# Patient Record
Sex: Male | Born: 1970 | Race: Black or African American | Hispanic: No | Marital: Single | Smoking: Never smoker
Health system: Southern US, Community
[De-identification: ages and names within clinical notes are randomized; demographics above are authoritative.]

## PROBLEM LIST (undated history)

## (undated) DIAGNOSIS — Z8719 Personal history of other diseases of the digestive system: Secondary | ICD-10-CM

## (undated) DIAGNOSIS — K625 Hemorrhage of anus and rectum: Secondary | ICD-10-CM

## (undated) DIAGNOSIS — D500 Iron deficiency anemia secondary to blood loss (chronic): Secondary | ICD-10-CM

## (undated) HISTORY — PX: STOMACH SURGERY: SHX791

## (undated) HISTORY — PX: ABDOMINAL SURGERY: SHX537

---
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
## 2000-11-20 ENCOUNTER — Emergency Department (HOSPITAL_COMMUNITY): Admission: EM | Admit: 2000-11-20 | Discharge: 2000-11-20 | Payer: Self-pay

## 2002-01-18 ENCOUNTER — Emergency Department (HOSPITAL_COMMUNITY): Admission: EM | Admit: 2002-01-18 | Discharge: 2002-01-18 | Payer: Self-pay

## 2004-09-03 ENCOUNTER — Emergency Department (HOSPITAL_COMMUNITY): Admission: EM | Admit: 2004-09-03 | Discharge: 2004-09-03 | Payer: Self-pay | Admitting: Emergency Medicine

## 2004-10-26 ENCOUNTER — Emergency Department (HOSPITAL_COMMUNITY): Admission: EM | Admit: 2004-10-26 | Discharge: 2004-10-26 | Payer: Self-pay | Admitting: Emergency Medicine

## 2007-06-24 ENCOUNTER — Emergency Department (HOSPITAL_COMMUNITY): Admission: EM | Admit: 2007-06-24 | Discharge: 2007-06-24 | Payer: Self-pay | Admitting: Emergency Medicine

## 2010-09-30 LAB — URINALYSIS, ROUTINE W REFLEX MICROSCOPIC
Bilirubin Urine: NEGATIVE
Glucose, UA: NEGATIVE
Ketones, ur: NEGATIVE
Leukocytes, UA: NEGATIVE
Nitrite: NEGATIVE
Protein, ur: NEGATIVE
Specific Gravity, Urine: 1.03
Urobilinogen, UA: 0.2
pH: 5.5

## 2010-09-30 LAB — URINE MICROSCOPIC-ADD ON

## 2011-12-22 ENCOUNTER — Emergency Department (HOSPITAL_COMMUNITY)
Admission: EM | Admit: 2011-12-22 | Discharge: 2011-12-22 | Disposition: A | Discharge status: Home / Self Care | Payer: Self-pay | Attending: Emergency Medicine | Admitting: Emergency Medicine

## 2011-12-22 ENCOUNTER — Emergency Department (HOSPITAL_COMMUNITY): Payer: Self-pay

## 2011-12-22 ENCOUNTER — Encounter (HOSPITAL_COMMUNITY): Payer: Self-pay | Admitting: Emergency Medicine

## 2011-12-22 DIAGNOSIS — R079 Chest pain, unspecified: Secondary | ICD-10-CM | POA: Insufficient documentation

## 2011-12-22 LAB — BASIC METABOLIC PANEL
CO2: 26 mEq/L (ref 19–32)
Chloride: 102 mEq/L (ref 96–112)
Potassium: 3.6 mEq/L (ref 3.5–5.1)
Sodium: 137 mEq/L (ref 135–145)

## 2011-12-22 LAB — CBC
MCH: 27.4 pg (ref 26.0–34.0)
Platelets: 207 10*3/uL (ref 150–400)
RBC: 5.18 MIL/uL (ref 4.22–5.81)
WBC: 4.1 10*3/uL (ref 4.0–10.5)

## 2011-12-22 LAB — COMPREHENSIVE METABOLIC PANEL
ALT: 25 U/L (ref 0–53)
AST: 36 U/L (ref 0–37)
CO2: 27 mEq/L (ref 19–32)
Calcium: 9.5 mg/dL (ref 8.4–10.5)
GFR calc non Af Amer: 73 mL/min — ABNORMAL LOW (ref >90)
Sodium: 136 mEq/L (ref 135–145)

## 2011-12-22 LAB — D-DIMER, QUANTITATIVE: D-Dimer, Quant: 0.68 ug/mL-FEU — ABNORMAL HIGH (ref 0.00–0.48)

## 2011-12-22 MED ORDER — OXYCODONE-ACETAMINOPHEN 5-325 MG PO TABS: 1.0000 | ORAL_TABLET | Freq: Four times a day (QID) | ORAL | Status: DC | PRN

## 2011-12-22 MED ORDER — IOHEXOL 350 MG/ML SOLN
100.0000 mL | Freq: Once | INTRAVENOUS | Status: AC | PRN
  Administered 2011-12-22: 100 mL via INTRAVENOUS

## 2011-12-22 MED ORDER — SODIUM CHLORIDE 0.9 % IV BOLUS (SEPSIS)
500.0000 mL | Freq: Once | INTRAVENOUS | Status: AC
  Administered 2011-12-22: 500 mL via INTRAVENOUS

## 2011-12-22 MED ORDER — IBUPROFEN 600 MG PO TABS: 600.0000 mg | ORAL_TABLET | Freq: Four times a day (QID) | ORAL | Status: DC | PRN

## 2011-12-22 NOTE — ED Notes (Signed)
Patient layed back in bed yesterday and his chest began hurting.  Patient's pain is palpable and worsens with movement.  Patient denies n/v/d, back pain, dizziness, or any other associated symptoms.

## 2011-12-22 NOTE — ED Provider Notes (Signed)
History     CSN: [id number]  Arrival date & time 12/22/11  1654   First MD Initiated Contact with Patient 12/22/11 1738      Chief Complaint  Patient presents with  . Chest Pain    (Consider location/radiation/quality/duration/timing/severity/associated sxs/prior treatment) Patient is a 41 y.o. male presenting with chest pain. The history is provided by the patient.  Chest Pain Pertinent negatives for primary symptoms include no shortness of breath, no abdominal pain, no nausea and no vomiting.  Pertinent negatives for associated symptoms include no numbness and no weakness.    patient with acute onset of chest pain that began yesterday while sitting in bed. No fevers. Is worse with movement. No difficulty breathing. No swelling in his leg, however he states that he also developed some pain in his left calf. He states his left hand is also tingling. No known cardiac history. He does not smoke. No known family history of cardiac disease. A sharp pain is improved from previous.  History reviewed. No pertinent past medical history.  No past surgical history on file.  No family history on file.  History  Substance Use Topics  . Smoking status: Not on file  . Smokeless tobacco: Not on file  . Alcohol Use: Not on file      Review of Systems  Constitutional: Negative for activity change and appetite change.  HENT: Negative for neck stiffness.   Eyes: Negative for pain.  Respiratory: Negative for chest tightness and shortness of breath.   Cardiovascular: Positive for chest pain. Negative for leg swelling.  Gastrointestinal: Negative for nausea, vomiting, abdominal pain and diarrhea.  Genitourinary: Negative for flank pain.  Musculoskeletal: Negative for back pain.  Skin: Negative for rash.  Neurological: Negative for weakness, numbness and headaches.  Psychiatric/Behavioral: Negative for behavioral problems.    Allergies  Review of patient's allergies indicates no known  allergies.  Home Medications   Current Outpatient Rx  Name  Route  Sig  Dispense  Refill  . IBUPROFEN 600 MG PO TABS   Oral   Take 1 tablet (600 mg total) by mouth every 6 (six) hours as needed for pain.   20 tablet   0   . OXYCODONE-ACETAMINOPHEN 5-325 MG PO TABS   Oral   Take 1-2 tablets by mouth every 6 (six) hours as needed for pain.   10 tablet   0     BP 104/62  Pulse 56  Temp 98.1 F (36.7 C) (Oral)  Resp 18  SpO2 100%  Physical Exam  Nursing note and vitals reviewed. Constitutional: He is oriented to person, place, and time. He appears well-developed and well-nourished.  HENT:  Head: Normocephalic and atraumatic.  Eyes: EOM are normal. Pupils are equal, round, and reactive to light.  Neck: Normal range of motion. Neck supple.  Cardiovascular: Normal rate, regular rhythm and normal heart sounds.   No murmur heard. Pulmonary/Chest: Effort normal and breath sounds normal. He exhibits tenderness.       Tenderness to upper sternal area without erythema or rash.  Abdominal: Soft. Bowel sounds are normal. He exhibits no distension and no mass. There is no tenderness. There is no rebound and no guarding.  Musculoskeletal: Normal range of motion. He exhibits no edema.  Neurological: He is alert and oriented to person, place, and time. No cranial nerve deficit.  Skin: Skin is warm and dry.  Psychiatric: He has a normal mood and affect.    ED Course  Procedures (including critical care  time)  Labs Reviewed  COMPREHENSIVE METABOLIC PANEL - Abnormal; Notable for the following:    Potassium 5.9 (*)     Glucose, Bld 104 (*)     Alkaline Phosphatase 33 (*)     GFR calc non Af Amer 73 (*)     GFR calc Af Amer 84 (*)     All other components within normal limits  D-DIMER, QUANTITATIVE - Abnormal; Notable for the following:    D-Dimer, Quant 0.68 (*)     All other components within normal limits  BASIC METABOLIC PANEL - Abnormal; Notable for the following:    GFR calc  non Af Amer 67 (*)     GFR calc Af Amer 78 (*)     All other components within normal limits  CBC  POCT I-STAT TROPONIN I   Ct Angio Chest W/cm &/or Wo Cm  12/22/2011  *RADIOLOGY REPORT*  Clinical Data: Chest pain  CT ANGIOGRAPHY CHEST  Technique:  Multidetector CT imaging of the chest using the standard protocol during bolus administration of intravenous contrast. Multiplanar reconstructed images including MIPs were obtained and reviewed to evaluate the vascular anatomy.  Contrast: OMNIPAQUE IOHEXOL 350 MG/ML SOLN  Comparison: Chest radiograph dated 12/22/2011  Findings: No evidence of pulmonary embolism.  Minimal dependent atelectasis in the bilateral lower lobes.  No suspicious pulmonary nodules. No pleural effusion or pneumothorax.  Visualized thyroid is unremarkable.  The heart is normal in size.  No pericardial effusion.  No suspicious mediastinal, hilar, or axillary lymphadenopathy.  Visualized upper abdomen is unremarkable.  Visualized osseous structures are within normal limits.  IMPRESSION: No evidence of pulmonary embolism.  No evidence of acute cardiopulmonary disease.   Original Report Authenticated By: Charline Bills, M.D.    Dg Chest Portable 1 View  12/22/2011  *RADIOLOGY REPORT*  Clinical Data: Chest pain  PORTABLE CHEST - 1 VIEW  Comparison: 09/03/2004  Findings: Mild cardiac enlargement without heart failure.  Lungs are clear.  Negative for pneumonia or effusion.  IMPRESSION: Cardiac enlargement without   acute cardiopulmonary disease.   Original Report Authenticated By: Janeece Riggers, M.D.      1. Chest pain     Date: 12/22/2011  Rate: 68  Rhythm: normal sinus rhythm  QRS Axis: normal  Intervals: normal  ST/T Wave abnormalities: normal  Conduction Disutrbances: none  Narrative Interpretation: unremarkable      MDM  Patient with upper chest pain. Worse with movement. EKG is reassuring laboratories reassuring except for positive d-dimer. CT angiography was done  which is negative for pulmonary embolus. Patient was discharged home. It is likely pain is chest wall.        Juliet Rude. Rubin Payor, MD 12/22/11 2036

## 2016-08-27 ENCOUNTER — Emergency Department (HOSPITAL_COMMUNITY)
Admission: EM | Admit: 2016-08-27 | Discharge: 2016-08-27 | Disposition: A | Discharge status: Home / Self Care | Payer: Self-pay | Attending: Emergency Medicine | Admitting: Emergency Medicine

## 2016-08-27 ENCOUNTER — Encounter (HOSPITAL_COMMUNITY): Payer: Self-pay | Admitting: Emergency Medicine

## 2016-08-27 DIAGNOSIS — Z5321 Procedure and treatment not carried out due to patient leaving prior to being seen by health care provider: Secondary | ICD-10-CM | POA: Insufficient documentation

## 2016-08-27 NOTE — ED Notes (Signed)
Pt going to urgent care.

## 2016-08-27 NOTE — ED Triage Notes (Signed)
Reports rash on back that started yesterday.  Has been on Bactrim for a boil.  Only one pill left in bottle.  Breathing easy and nonlabored.

## 2018-09-03 ENCOUNTER — Encounter: Payer: Self-pay | Admitting: Physician Assistant

## 2018-09-13 ENCOUNTER — Ambulatory Visit (INDEPENDENT_AMBULATORY_CARE_PROVIDER_SITE_OTHER): Payer: Self-pay | Admitting: Physician Assistant

## 2018-09-13 ENCOUNTER — Other Ambulatory Visit: Payer: Self-pay

## 2018-09-13 ENCOUNTER — Encounter: Payer: Self-pay | Admitting: Physician Assistant

## 2018-09-13 VITALS — BP 112/62 | HR 80 | Temp 98.7°F | Ht 66.0 in | Wt 182.6 lb

## 2018-09-13 DIAGNOSIS — K625 Hemorrhage of anus and rectum: Secondary | ICD-10-CM

## 2018-09-13 DIAGNOSIS — K641 Second degree hemorrhoids: Secondary | ICD-10-CM

## 2018-09-13 MED ORDER — HYDROCORTISONE 1 % EX OINT: 1.0000 "application " | TOPICAL_OINTMENT | Freq: Two times a day (BID) | CUTANEOUS | 0 refills | Status: AC

## 2018-09-13 NOTE — Progress Notes (Signed)
Chief Complaint: Rectal bleeding, hemorrhoids  HPI:    Mr. Avera is a 48 year old AA male witKizzie Baneh a past medical history as listed below, who presents to clinic today for rectal bleeding and hemorrhoids.    Today, the patient resents clinic as a self-referral for rectal bleeding and hemorrhoids.  Tells me that he has always had some issues with hemorrhoids since his 6320s when he used to lift weights, typically treats this with over-the-counter Preparation H which sometimes helps.  Over the past 2 years he has noticed an increase in bleeding episodes telling me that he will have some bright red blood from his rectum which can be "quite a lot", even sometimes it "drips down my leg".  This can happen for a week at a time or 2 and then not happen for a month or 2 and then come back.  Also notes that his bowel movements tend to alternate between constipation and looser stool.  Does admit to sometimes feeling a "bulge down there".  Denies any pain or stabbing discomfort with bowel movement.  Last time he saw blood was yesterday, just a little bit with a stool.  Has tried suppositories over-the-counter in the past which only helped a little bit.    Admits to daily marijuana use.    Denies fever, chills, weight loss, anorexia, vomiting or symptoms that awaken him from sleep.  Past medical history: None  Past Surgical History:  Procedure Laterality Date  . STOMACH SURGERY     as child - age 48    No current outpatient medications on file.   No current facility-administered medications for this visit.     Allergies as of 09/13/2018  . (No Known Allergies)    Family History  Problem Relation Age of Onset  . Liver disease Father   . Cirrhosis Father   . Diabetes Father     Social History   Socioeconomic History  . Marital status: Single    Spouse name: Not on file  . Number of children: Not on file  . Years of education: Not on file  . Highest education level: Not on file  Occupational  History  . Occupation: Water quality scientistlandscaping   Social Needs  . Financial resource strain: Not on file  . Food insecurity    Worry: Not on file    Inability: Not on file  . Transportation needs    Medical: Not on file    Non-medical: Not on file  Tobacco Use  . Smoking status: Never Smoker  . Smokeless tobacco: Never Used  Substance and Sexual Activity  . Alcohol use: Yes    Comment: occasionally  . Drug use: Yes    Types: Marijuana  . Sexual activity: Yes  Lifestyle  . Physical activity    Days per week: Not on file    Minutes per session: Not on file  . Stress: Not on file  Relationships  . Social Musicianconnections    Talks on phone: Not on file    Gets together: Not on file    Attends religious service: Not on file    Active member of club or organization: Not on file    Attends meetings of clubs or organizations: Not on file    Relationship status: Not on file  . Intimate partner violence    Fear of current or ex partner: Not on file    Emotionally abused: Not on file    Physically abused: Not on file    Forced sexual  activity: Not on file  Other Topics Concern  . Not on file  Social History Narrative  . Not on file    Review of Systems:    Constitutional: No fever or chills Skin: No rash  Cardiovascular: No chest pain Respiratory: No SOB  Gastrointestinal: See HPI and otherwise negative Genitourinary: No dysuria Neurological: No headache, dizziness or syncope Musculoskeletal: No new muscle or joint pain Hematologic: No  bruising Psychiatric: No history of depression or anxiety   Physical Exam:  Vital signs: BP 112/62   Pulse 80   Temp 98.7 F (37.1 C)   Ht 5\' 6"  (1.676 m)   Wt 182 lb 9.6 oz (82.8 kg)   BMI 29.47 kg/m   Constitutional:   Pleasant AA male appears to be in NAD, Well developed, Well nourished, alert and cooperative Head:  Normocephalic and atraumatic. Eyes:   PEERL, EOMI. No icterus. Conjunctiva pink. Ears:  Normal auditory acuity. Neck:  Supple  Throat: Oral cavity and pharynx without inflammation, swelling or lesion.  Respiratory: Respirations even and unlabored. Lungs clear to auscultation bilaterally.   No wheezes, crackles, or rhonchi.  Cardiovascular: Normal S1, S2. No MRG. Regular rate and rhythm. No peripheral edema, cyanosis or pallor.  Gastrointestinal:  Soft, nondistended, nontender. No rebound or guarding. Normal bowel sounds. No appreciable masses or hepatomegaly. Rectal:  External: 2 external hemorrhoids, enlarged, one prolapsing internal hemorrhoid; Internal: no mass, fullness to palpation; Anoscopy: Grade II hemorrhoids Msk:  Symmetrical without gross deformities. Without edema, no deformity or joint abnormality.  Neurologic:  Alert and  oriented x4;  grossly normal neurologically.  Skin:   Dry and intact without significant lesions or rashes. Psychiatric: Demonstrates good judgement and reason without abnormal affect or behaviors.  No recent labs or imaging.  Assessment: 1.  Grade 2 hemorrhoids: Seen today, likely source of bleeding 2.  Rectal bleeding: Most likely from above  Plan: 1.  Prescribed Hydrocortisone ointment 1% to be used twice daily x14 days.  Also advised applying some to a Preparation H suppository and inserting twice daily x14 days. 2.  Discussed with patient that if he continues bleeding after that time would recommend a colonoscopy for further evaluation.  This would need to be scheduled with Dr. Henrene Pastor in the [facility].  Did discuss risks, benefits, limitations and alternatives and patient agrees to proceed if necessary. 3.  Also briefly discussed hemorrhoid banding with the patient.  We would need to do a complete colonoscopy first and then decide if he is a good candidate for this. 4.  Patient to follow in clinic with me as needed. Assigned to Dr. Henrene Pastor today.  Ellouise Newer, PA-C [facility] 09/13/2018, 2:54 PM

## 2018-09-13 NOTE — Patient Instructions (Signed)
We have sent the following medications to your pharmacy for you to pick up at your convenience:  Hydrocortisone cream 1% rectally two times daily for 14 days  Please pick up over the counter preparation H suppositories.  Please call us with any concerns or you have more rectal bleeding.  Thank you for choosing me and  Gastroenterology  Ellouise Newer- PA-C

## 2018-09-13 NOTE — Progress Notes (Signed)
Reviewed PA evaluation. Anderson Malta, I would recommend a colonoscopy in this 48yo AA with rectal bleeding. Must rule out causes other than hemorrhoids. Please discuss with your patient and arrange. Thanks  Dr. Henrene Pastor

## 2018-09-14 ENCOUNTER — Telehealth: Payer: Self-pay

## 2018-09-14 NOTE — Telephone Encounter (Signed)
Attempted to call patient to schedule a Colonoscopy with Dr. Henrene Pastor. Home ph#-mail box full/Work ph# no answer and no voice mail/No My Chart. Will try again later

## 2018-09-18 ENCOUNTER — Telehealth: Payer: Self-pay

## 2018-09-18 NOTE — Telephone Encounter (Signed)
Have tried multiple times since 09/14/18 to reach patient by phone, mail box is full on 1 phone# and the other has no voice mail. Sent letter requesting patient to call us to schedule a colonoscopy with Dr. Henrene Pastor

## 2019-03-17 ENCOUNTER — Encounter (HOSPITAL_COMMUNITY): Payer: Self-pay

## 2019-03-17 ENCOUNTER — Emergency Department (HOSPITAL_COMMUNITY): Payer: Self-pay

## 2019-03-17 ENCOUNTER — Emergency Department (HOSPITAL_COMMUNITY)
Admission: EM | Admit: 2019-03-17 | Discharge: 2019-03-18 | Disposition: A | Discharge status: Home / Self Care | Payer: Self-pay | Attending: Emergency Medicine | Admitting: Emergency Medicine

## 2019-03-17 ENCOUNTER — Other Ambulatory Visit: Payer: Self-pay

## 2019-03-17 DIAGNOSIS — R0602 Shortness of breath: Secondary | ICD-10-CM | POA: Insufficient documentation

## 2019-03-17 DIAGNOSIS — Z5321 Procedure and treatment not carried out due to patient leaving prior to being seen by health care provider: Secondary | ICD-10-CM | POA: Insufficient documentation

## 2019-03-17 LAB — BASIC METABOLIC PANEL
Anion gap: 14 (ref 5–15)
BUN: 19 mg/dL (ref 6–20)
CO2: 27 mmol/L (ref 22–32)
Calcium: 9.8 mg/dL (ref 8.9–10.3)
Chloride: 99 mmol/L (ref 98–111)
Creatinine, Ser: 1.57 mg/dL — ABNORMAL HIGH (ref 0.61–1.24)
GFR calc Af Amer: >60 mL/min (ref >60)
GFR calc non Af Amer: 52 mL/min — ABNORMAL LOW (ref >60)
Glucose, Bld: 158 mg/dL — ABNORMAL HIGH (ref 70–99)
Potassium: 3.8 mmol/L (ref 3.5–5.1)
Sodium: 140 mmol/L (ref 135–145)

## 2019-03-17 LAB — CBC
HCT: 47.2 % (ref 39.0–52.0)
Hemoglobin: 15.4 g/dL (ref 13.0–17.0)
MCH: 28.1 pg (ref 26.0–34.0)
MCHC: 32.6 g/dL (ref 30.0–36.0)
MCV: 86 fL (ref 80.0–100.0)
Platelets: 283 10*3/uL (ref 150–400)
RBC: 5.49 MIL/uL (ref 4.22–5.81)
RDW: 12.3 % (ref 11.5–15.5)
WBC: 8.9 10*3/uL (ref 4.0–10.5)
nRBC: 0 % (ref 0.0–0.2)

## 2019-03-17 LAB — TROPONIN I (HIGH SENSITIVITY): Troponin I (High Sensitivity): 3 ng/L (ref <18)

## 2019-03-17 MED ORDER — ALBUTEROL SULFATE (2.5 MG/3ML) 0.083% IN NEBU: 5.0000 mg | INHALATION_SOLUTION | Freq: Once | RESPIRATORY_TRACT | Status: DC

## 2019-03-17 NOTE — ED Triage Notes (Signed)
Pt arrives to ED w/ c/o acute onset of SOB. Pt endorses diaphoresis. Pt denies CP, cough, fever.

## 2019-03-26 NOTE — ED Provider Notes (Signed)
This patient was not seen by myself.  Labs & testing ordered by the intake/triage staff in my name.  There are no discharge instructions or discharge order placed.  Therefore it is likely he left without being seen.   Terald Sleeper, MD 03/26/19 1016

## 2020-01-07 ENCOUNTER — Ambulatory Visit (INDEPENDENT_AMBULATORY_CARE_PROVIDER_SITE_OTHER): Payer: Self-pay | Admitting: Gastroenterology

## 2020-01-07 ENCOUNTER — Encounter: Payer: Self-pay | Admitting: Gastroenterology

## 2020-01-07 VITALS — BP 116/72 | HR 84 | Ht 67.0 in | Wt 196.0 lb

## 2020-01-07 DIAGNOSIS — K6289 Other specified diseases of anus and rectum: Secondary | ICD-10-CM

## 2020-01-07 DIAGNOSIS — K625 Hemorrhage of anus and rectum: Secondary | ICD-10-CM

## 2020-01-07 MED ORDER — SUTAB 1479-225-188 MG PO TABS: 1.0000 | ORAL_TABLET | Freq: Once | ORAL | 0 refills | Status: AC

## 2020-01-07 NOTE — Progress Notes (Addendum)
     01/07/2020 John Mccarty [id number] [birth year]   HISTORY OF PRESENT ILLNESS: This is a 50 year old male who had his care established with Dr. Marina Goodell back in September 2020 when he saw Hyacinth Meeker, PA-C, for complaints of rectal bleeding.  He was supposed to schedule colonoscopy, but that was never done.  Today he returns again with complaints of rectal bleeding and rectal pain.  He says that he wants to have hemorrhoid banding and was interested in finding out cost, etc. for that since he does not have insurance coverage.  He states that his bleeding sometimes is enough that it gets into his boxers, etc.  He says that he has some rectal discomfort that is constant at this point.  Hydrocortisone creams, etc. have not helped.   No past medical history on file. Past Surgical History:  Procedure Laterality Date  . STOMACH SURGERY     as child - age 5    reports that he has never smoked. He has never used smokeless tobacco. He reports current alcohol use. He reports current drug use. Drug: Marijuana. family history includes Cirrhosis in his father; Diabetes in his father; Liver disease in his father. No Known Allergies    No outpatient encounter medications on file as of 01/07/2020.   No facility-administered encounter medications on file as of 01/07/2020.    REVIEW OF SYSTEMS  : All other systems reviewed and negative except where noted in the History of Present Illness.   PHYSICAL EXAM: BP 116/72   Pulse 84   Ht 5\' 7"  (1.702 m)   Wt 196 lb (88.9 kg)   BMI 30.70 kg/m  General: Well developed AA male in no acute distress Head: Normocephalic and atraumatic Eyes:  Sclerae anicteric, conjunctiva pink. Ears: Normal auditory acuity Lungs: Clear throughout to auscultation; no W/R/R. Heart: Regular rate and rhythm; no M/R/G. Abdomen: Soft, non-distended.  BS present.  Non-tender. Rectal:  Small non-inflamed external hemorrhoid noted.  No fissure seen and no pain on DRE.  No masses  felt on DRE.  No blood or stool noted on exam glove. Musculoskeletal: Symmetrical with no gross deformities  Skin: No lesions on visible extremities Extremities: No edema  Neurological: Alert oriented x 4, grossly non-focal Psychological:  Alert and cooperative. Normal mood and affect  ASSESSMENT AND PLAN: *Rectal pain and bleeding: Bleeding could certainly be from some internal hemorrhoids.  I did not see any cause of his pain today.  It was previously recommended that he have a colonoscopy.  At this point he does not have insurance and is concerned about that.  He is interested in possible hemorrhoid banding, advised that he needs to have colonoscopy first.  He was supplied with [facility] assistance paperwork and he is going to look into getting some type of insurance coverage.  We will schedule his colonoscopy for sometime in mid to late March for now to give him some time to try to work that out.  If he is able to get coverage sooner he wants to reschedule he is certainly welcome to do so.  **The risks, benefits, and alternatives to colonoscopy were discussed with the patient and he consents to proceed.   CC:  No ref. provider found

## 2020-01-07 NOTE — Progress Notes (Signed)
Agree that colonoscopy is the next step in this 50 year old with unexplained rectal bleeding

## 2020-01-07 NOTE — Patient Instructions (Signed)
If you are age 48 or older, your body mass index should be between 23-30. Your Body mass index is 30.7 kg/m. If this is out of the aforementioned range listed, please consider follow up with your Primary Care Provider.  If you are age 30 or younger, your body mass index should be between 19-25. Your Body mass index is 30.7 kg/m. If this is out of the aformentioned range listed, please consider follow up with your Primary Care Provider.   You have been scheduled for a colonoscopy. Please follow written instructions given to you at your visit today.  Please pick up your prep supplies at the pharmacy within the next 1-3 days. If you use inhalers (even only as needed), please bring them with you on the day of your procedure.  Thank you for choosing me and [facility].  Doug Sou, PA-C

## 2020-03-18 ENCOUNTER — Encounter: Payer: Self-pay | Admitting: Internal Medicine

## 2021-06-22 IMAGING — CR DG CHEST 2V
2 series · 2 of 2 positions shown · non-contrast
Comparison: December 22, 2011

CLINICAL DATA: Weakness, dizziness and shortness of breath.

EXAM:
CHEST - 2 VIEW

[chest pa]
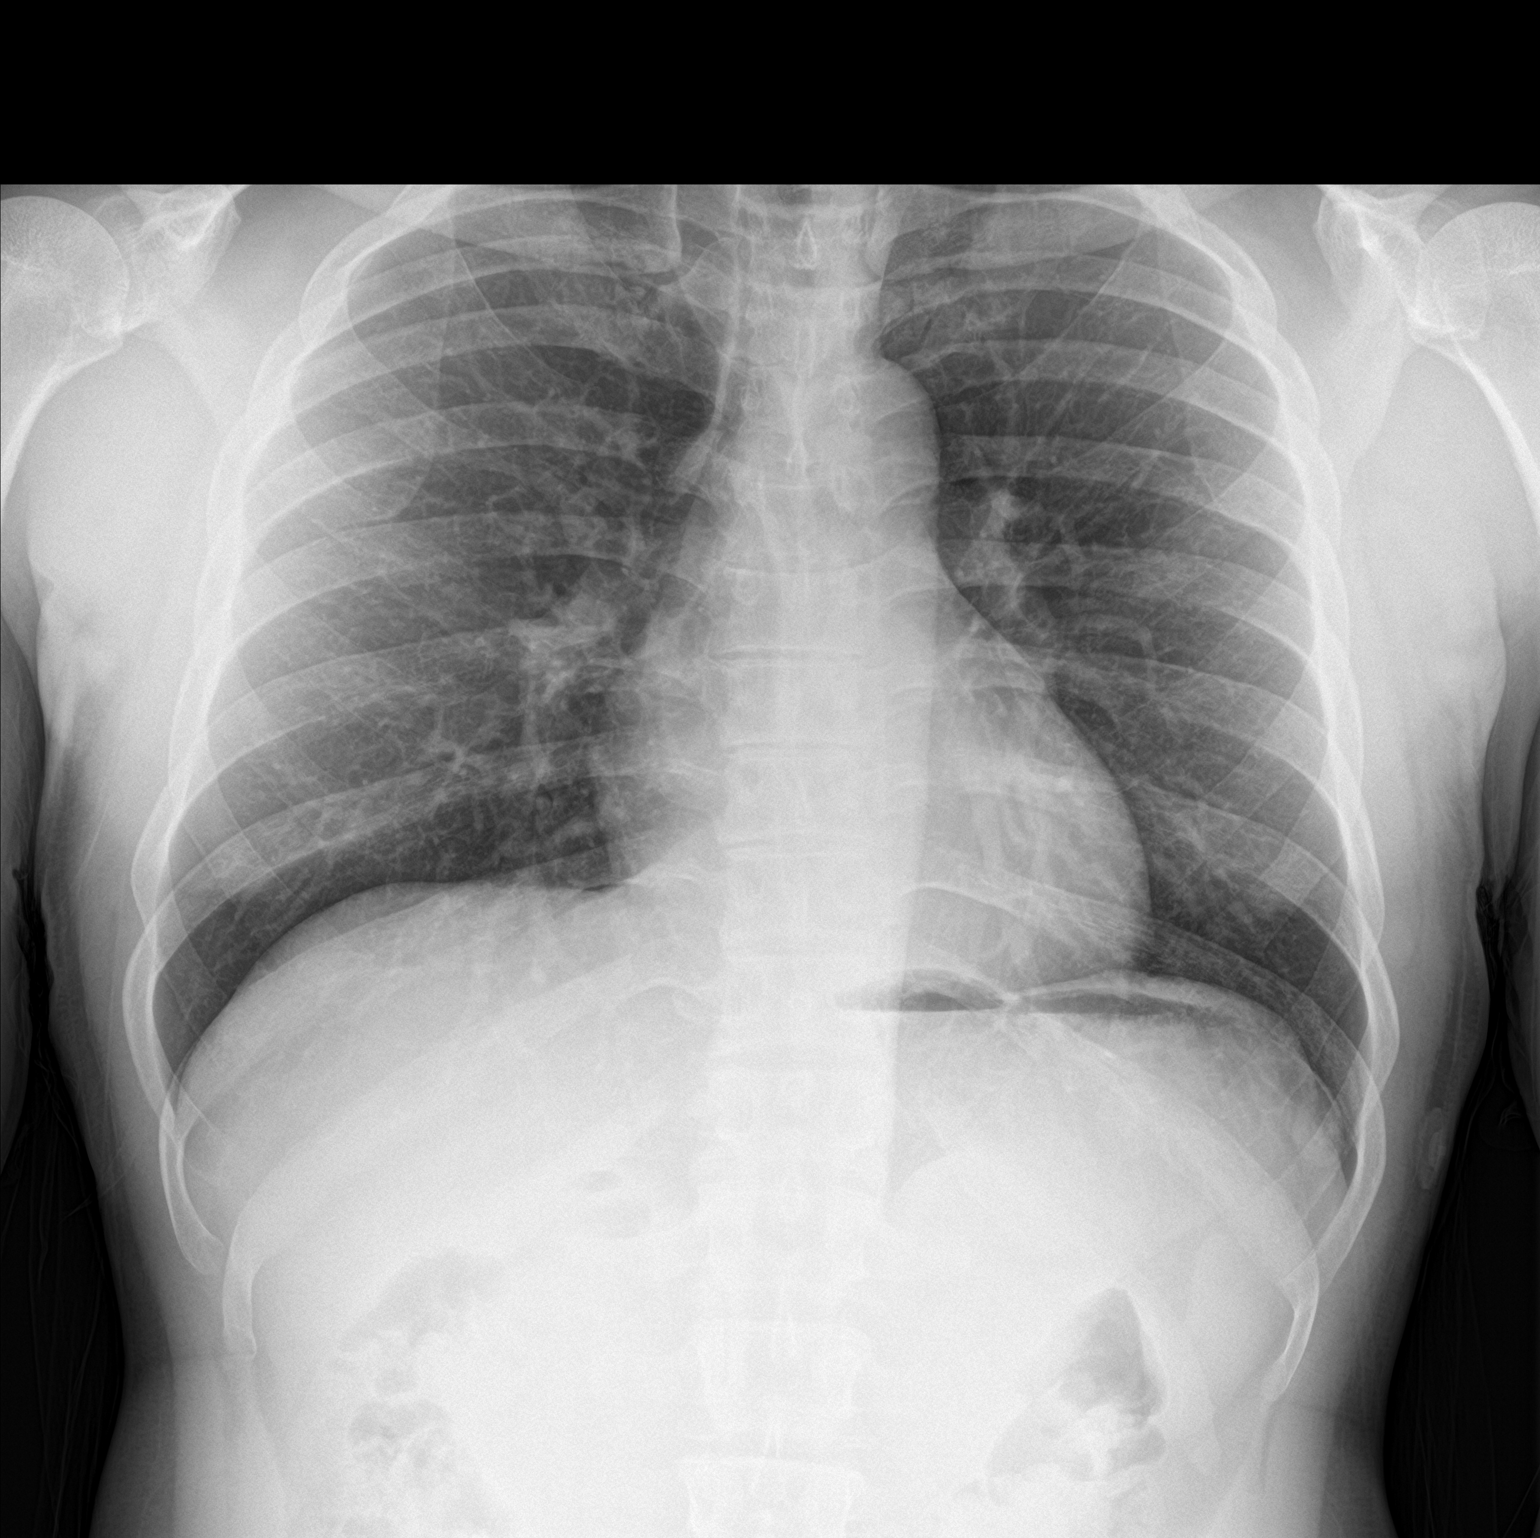

[chest lat]
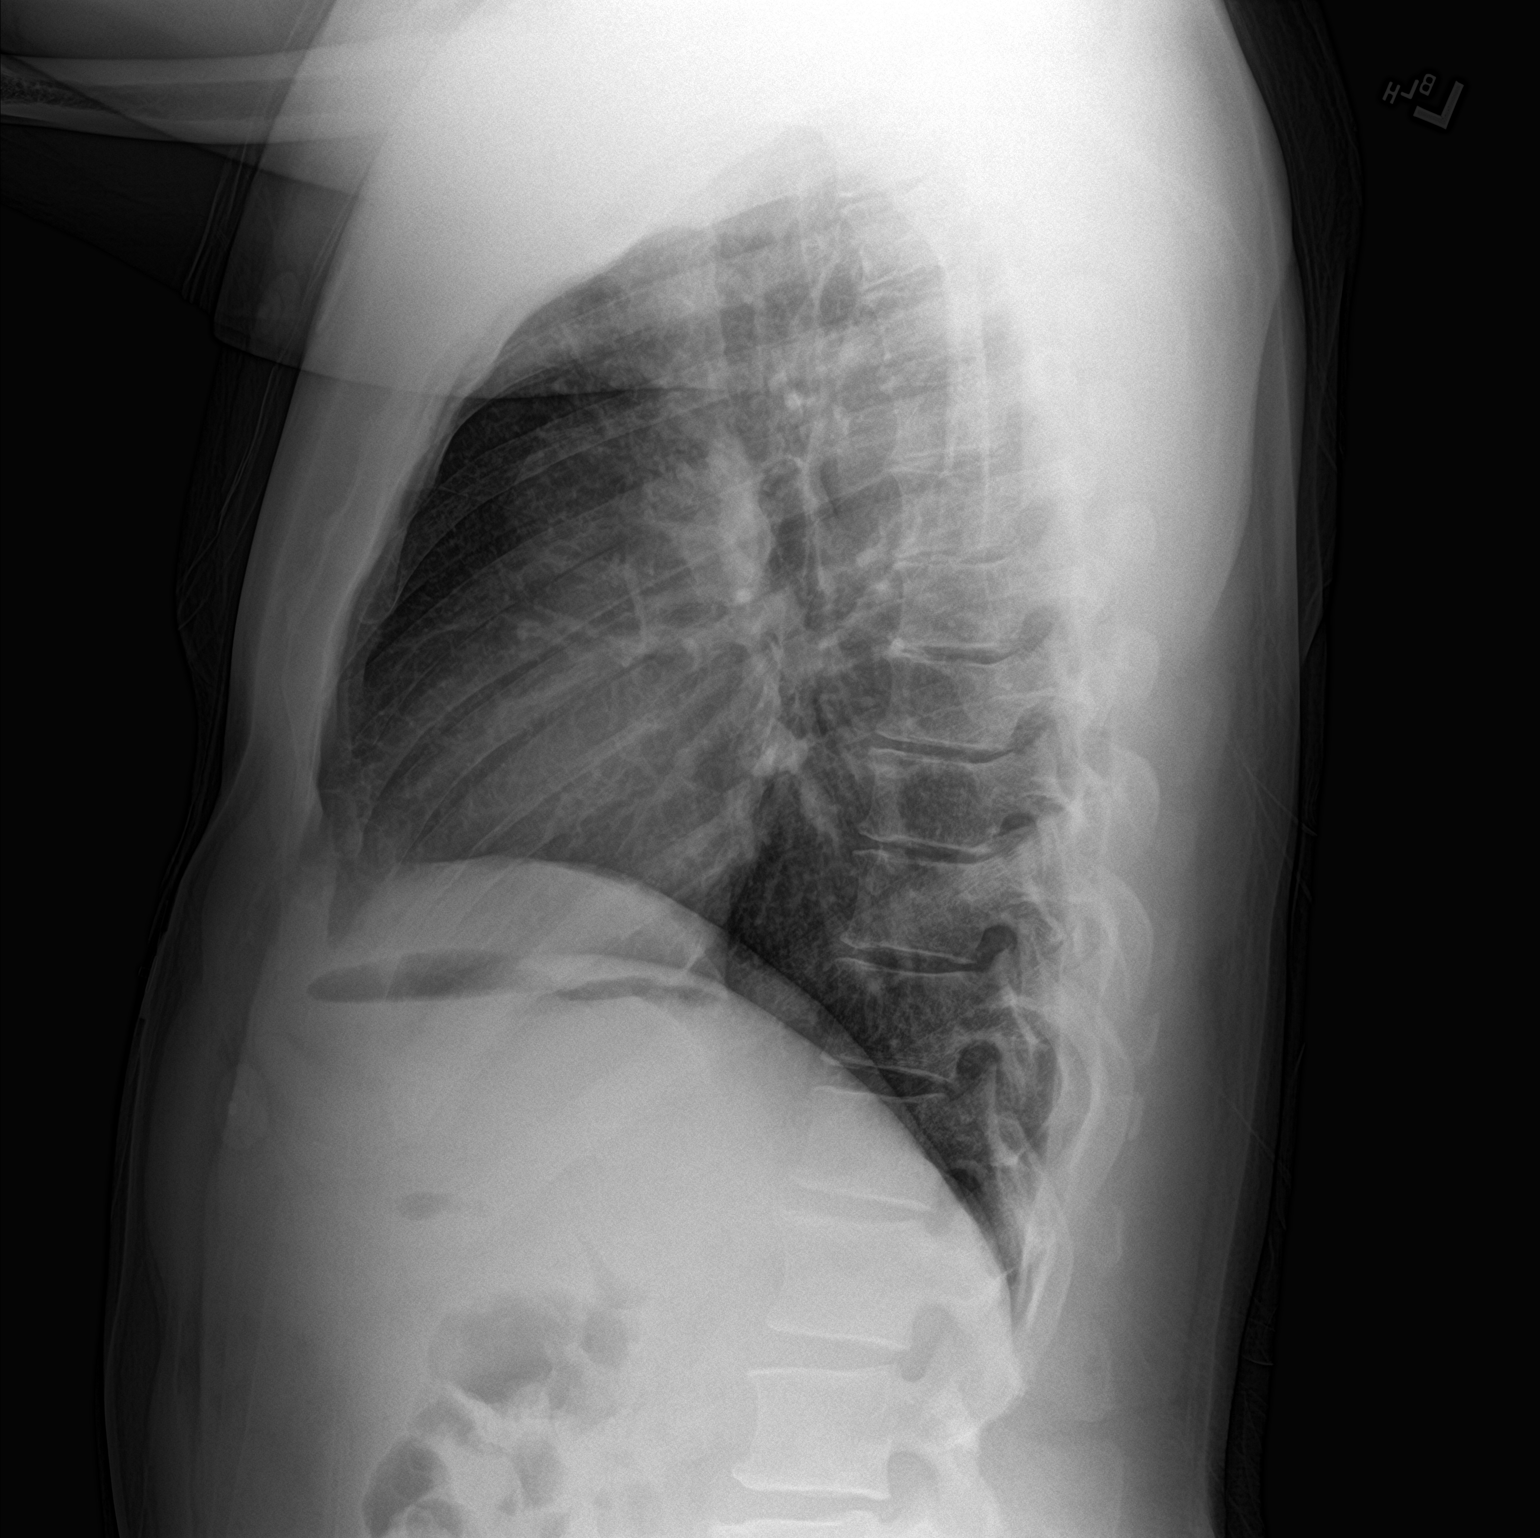

[2 of 2 positions shown; findings below may reference images not displayed]

FINDINGS: The heart size and mediastinal contours are within normal limits.
Both lungs are clear. The visualized skeletal structures are
unremarkable.
IMPRESSION: No active cardiopulmonary disease.

## 2022-04-21 DIAGNOSIS — H00014 Hordeolum externum left upper eyelid: Secondary | ICD-10-CM | POA: Diagnosis not present

## 2022-04-21 DIAGNOSIS — H6692 Otitis media, unspecified, left ear: Secondary | ICD-10-CM | POA: Diagnosis not present

## 2022-05-10 ENCOUNTER — Observation Stay (HOSPITAL_COMMUNITY)
Admission: EM | Admit: 2022-05-10 | Discharge: 2022-05-13 | Disposition: A | Discharge status: Home / Self Care | Payer: 59 | Attending: Internal Medicine | Admitting: Internal Medicine

## 2022-05-10 ENCOUNTER — Emergency Department (HOSPITAL_COMMUNITY): Payer: 59

## 2022-05-10 ENCOUNTER — Other Ambulatory Visit: Payer: Self-pay

## 2022-05-10 ENCOUNTER — Encounter (HOSPITAL_COMMUNITY): Payer: Self-pay

## 2022-05-10 DIAGNOSIS — Z131 Encounter for screening for diabetes mellitus: Secondary | ICD-10-CM | POA: Diagnosis not present

## 2022-05-10 DIAGNOSIS — Z13228 Encounter for screening for other metabolic disorders: Secondary | ICD-10-CM | POA: Diagnosis not present

## 2022-05-10 DIAGNOSIS — K5730 Diverticulosis of large intestine without perforation or abscess without bleeding: Secondary | ICD-10-CM | POA: Diagnosis not present

## 2022-05-10 DIAGNOSIS — K922 Gastrointestinal hemorrhage, unspecified: Secondary | ICD-10-CM | POA: Diagnosis not present

## 2022-05-10 DIAGNOSIS — K2970 Gastritis, unspecified, without bleeding: Secondary | ICD-10-CM | POA: Diagnosis not present

## 2022-05-10 DIAGNOSIS — R059 Cough, unspecified: Secondary | ICD-10-CM | POA: Diagnosis not present

## 2022-05-10 DIAGNOSIS — Z13220 Encounter for screening for lipoid disorders: Secondary | ICD-10-CM | POA: Diagnosis not present

## 2022-05-10 DIAGNOSIS — D649 Anemia, unspecified: Secondary | ICD-10-CM | POA: Diagnosis not present

## 2022-05-10 DIAGNOSIS — R0602 Shortness of breath: Secondary | ICD-10-CM | POA: Diagnosis not present

## 2022-05-10 DIAGNOSIS — D509 Iron deficiency anemia, unspecified: Secondary | ICD-10-CM | POA: Diagnosis present

## 2022-05-10 DIAGNOSIS — D62 Acute posthemorrhagic anemia: Secondary | ICD-10-CM | POA: Diagnosis present

## 2022-05-10 DIAGNOSIS — T7840XA Allergy, unspecified, initial encounter: Secondary | ICD-10-CM

## 2022-05-10 DIAGNOSIS — K648 Other hemorrhoids: Secondary | ICD-10-CM | POA: Diagnosis not present

## 2022-05-10 DIAGNOSIS — R9431 Abnormal electrocardiogram [ECG] [EKG]: Secondary | ICD-10-CM | POA: Diagnosis not present

## 2022-05-10 DIAGNOSIS — R42 Dizziness and giddiness: Secondary | ICD-10-CM | POA: Diagnosis not present

## 2022-05-10 DIAGNOSIS — K625 Hemorrhage of anus and rectum: Secondary | ICD-10-CM | POA: Diagnosis not present

## 2022-05-10 LAB — FOLATE: Folate: 15.2 ng/mL (ref >5.9)

## 2022-05-10 LAB — COMPREHENSIVE METABOLIC PANEL
ALT: 18 U/L (ref 0–44)
AST: 21 U/L (ref 15–41)
Albumin: 4.4 g/dL (ref 3.5–5.0)
Alkaline Phosphatase: 34 U/L — ABNORMAL LOW (ref 38–126)
Anion gap: 11 (ref 5–15)
BUN: 12 mg/dL (ref 6–20)
CO2: 21 mmol/L — ABNORMAL LOW (ref 22–32)
Calcium: 9.2 mg/dL (ref 8.9–10.3)
Chloride: 108 mmol/L (ref 98–111)
Creatinine, Ser: 1.23 mg/dL (ref 0.61–1.24)
GFR, Estimated: >60 mL/min (ref >60)
Glucose, Bld: 90 mg/dL (ref 70–99)
Potassium: 3.5 mmol/L (ref 3.5–5.1)
Sodium: 140 mmol/L (ref 135–145)
Total Bilirubin: 0.5 mg/dL (ref 0.3–1.2)
Total Protein: 7.9 g/dL (ref 6.5–8.1)

## 2022-05-10 LAB — IRON AND TIBC
Iron: 21 ug/dL — ABNORMAL LOW (ref 45–182)
Saturation Ratios: 4 % — ABNORMAL LOW (ref 17.9–39.5)
TIBC: 490 ug/dL — ABNORMAL HIGH (ref 250–450)
UIBC: 469 ug/dL

## 2022-05-10 LAB — TYPE AND SCREEN
ABO/RH(D): B POS
Antibody Screen: NEGATIVE

## 2022-05-10 LAB — FERRITIN: Ferritin: 3 ng/mL — ABNORMAL LOW (ref 24–336)

## 2022-05-10 LAB — CBC
HCT: 34.2 % — ABNORMAL LOW (ref 39.0–52.0)
Hemoglobin: 9.5 g/dL — ABNORMAL LOW (ref 13.0–17.0)
MCH: 20 pg — ABNORMAL LOW (ref 26.0–34.0)
MCHC: 27.8 g/dL — ABNORMAL LOW (ref 30.0–36.0)
MCV: 72.2 fL — ABNORMAL LOW (ref 80.0–100.0)
Platelets: 343 10*3/uL (ref 150–400)
RBC: 4.74 MIL/uL (ref 4.22–5.81)
RDW: 17.2 % — ABNORMAL HIGH (ref 11.5–15.5)
WBC: 6.4 10*3/uL (ref 4.0–10.5)
nRBC: 0 % (ref 0.0–0.2)

## 2022-05-10 LAB — I-STAT BETA HCG BLOOD, ED (MC, WL, AP ONLY): I-stat hCG, quantitative: <5 m[IU]/mL (ref <5)

## 2022-05-10 LAB — RETICULOCYTES
Immature Retic Fract: 17.4 % — ABNORMAL HIGH (ref 2.3–15.9)
RBC.: 4.15 MIL/uL — ABNORMAL LOW (ref 4.22–5.81)
Retic Count, Absolute: 37.4 10*3/uL (ref 19.0–186.0)
Retic Ct Pct: 0.9 % (ref 0.4–3.1)

## 2022-05-10 LAB — POC OCCULT BLOOD, ED: Fecal Occult Bld: NEGATIVE

## 2022-05-10 LAB — VITAMIN B12: Vitamin B-12: 519 pg/mL (ref 180–914)

## 2022-05-10 NOTE — H&P (Signed)
PCP:   Pcp, No   Chief Complaint:  Shortness of breath  HPI: This is a 52 year old male who presents with complaints of shortness of breath.  Per patient he has had rectal bleeding for this last 8 years.  Per patient he has external hemorrhoids.  Recently has been having blood with each BM.  Patient represents significantly increased frequency.  He endorses lightheadedness, dizziness, and shortness of breath.  Patient denies abdominal pain, fever but endorses chills.  In the ER patient occult stool negative.  Patient's initial hemoglobin was 9.5.  Repeat 8.6.  Patient has no prior labs for comparison.  Admission for GI consult requested for symptomatic anemia.  Review of Systems:  Per HPI  Past Medical History: History reviewed. No pertinent past medical history. Past Surgical History:  Procedure Laterality Date   ABDOMINAL SURGERY      Medications: Prior to Admission medications   Medication Sig Start Date End Date Taking? Authorizing Provider  Pseudoeph-Doxylamine-DM-APAP (NYQUIL PO) Take 30 mLs by mouth daily as needed (For cold symptoms).   Yes [provider]    Allergies:  No Known Allergies  Social History:  reports that he has never smoked. He has never used smokeless tobacco. He reports current alcohol use. He reports current drug use. Drug: Marijuana.  Family History: History reviewed. No pertinent family history.  Physical Exam: Vitals:   05/10/22 1822 05/10/22 1823 05/10/22 1930  BP: 127/77  120/71  Pulse: 62  62  Resp: 18  (!) 24  Temp: 99.2 F (37.3 C)  98.3 F (36.8 C)  TempSrc: Oral  Oral  SpO2: 100%  100%  Weight:  87.1 kg   Height:  5\' 7"  (1.702 m)     General:  Alert and oriented times three, well developed and nourished, no acute distress Eyes: PERRLA, pink conjunctiva, no scleral icterus ENT: Moist oral mucosa, neck supple, no thyromegaly Lungs: clear to ascultation, no wheeze, no crackles, no use of accessory  muscles Cardiovascular: regular rate and rhythm, no regurgitation, no gallops, no murmurs. No carotid bruits, no JVD Abdomen: soft, positive BS, non-tender, non-distended, no organomegaly, not an acute abdomen GU: not examined Neuro: CN II - XII grossly intact, sensation intact Musculoskeletal: strength 5/5 all extremities, no clubbing, cyanosis or edema Skin: no rash, no subcutaneous crepitation, no decubitus Psych: appropriate patient   Labs on Admission:  Recent Labs    05/10/22 1834  NA 140  K 3.5  CL 108  CO2 21*  GLUCOSE 90  BUN 12  CREATININE 1.23  CALCIUM 9.2   Recent Labs    05/10/22 1834  AST 21  ALT 18  ALKPHOS 34*  BILITOT 0.5  PROT 7.9  ALBUMIN 4.4    Recent Labs    05/10/22 1834  WBC 6.4  HGB 9.5*  HCT 34.2*  MCV 72.2*  PLT 343   Recent Labs    05/10/22 2150  VITAMINB12 519  FOLATE 15.2  FERRITIN 3*  TIBC 490*  IRON 21*  RETICCTPCT 0.9    Micro Results: No results found for this or any previous visit (from the past 240 hour(s)).   Radiological Exams on Admission: DG Chest 2 View  Result Date: 05/10/2022 CLINICAL DATA:  Cough and shortness of breath EXAM: CHEST - 2 VIEW COMPARISON:  Chest x-ray 03/17/2019 FINDINGS: The heart size and mediastinal contours are within normal limits. Both lungs are clear. The visualized skeletal structures are unremarkable. IMPRESSION: No active cardiopulmonary disease. Electronically Signed   By: Linton Rump  Malena Edman M.D.   On: 05/10/2022 20:23    Assessment/Plan Present on Admission: GI bleed -N.p.o., IV fluid hydration -GI contacted by EDP -Serial H&H -Occult stool negative in ER.  Iron Deficiency anemia -Anemia panel ordered  Christinna Sprung 05/10/2022, 10:57 PM

## 2022-05-10 NOTE — ED Triage Notes (Signed)
Pt c/o rectal bleeding started yesterday. Pt states he has been having a lot of red bleeding every time he has a BM. Pt states the blood has been "all over the place" since yesterday. Pt c/o SOB started two days ago. Pt able to speak in complete sentences. Pt denies abd pain. Pt c/o dizziness and weakness started yesterday.

## 2022-05-10 NOTE — ED Provider Notes (Signed)
[place] EMERGENCY DEPARTMENT AT [facility] Provider Note   CSN: [id number] Arrival date & time: 05/10/22  1817     History  Chief Complaint  Patient presents with   GI Bleeding    John Mccarty is a 52 y.o. male.  HPI   Patient presents to the ED for evaluations of a few complaints.  He states he started having some rectal bleeding yesterday.  He does have history of hemorrhoids.  He wonders if that was causing some recurrent issues.  He is concerned he may have lost blood from his rectal bleeding.  Patient denies any abdominal pain.  No vomiting.  Patient states he also has been feeling short of breath.  He denies any coughing.  No chest pain.  No leg swelling.  He has been feeling lightheaded.  Home Medications Prior to Admission medications   Not on File      Allergies    Patient has no known allergies.    Review of Systems   Review of Systems  Physical Exam Updated Vital Signs BP 120/71   Pulse 62   Temp 98.3 F (36.8 C) (Oral)   Resp (!) 24   Ht 1.702 m (5\' 7" )   Wt 87.1 kg   SpO2 100%   BMI 30.07 kg/m  Physical Exam Vitals and nursing note reviewed.  Constitutional:      General: He is not in acute distress.    Appearance: He is well-developed.  HENT:     Head: Normocephalic and atraumatic.     Right Ear: External ear normal.     Left Ear: External ear normal.  Eyes:     General: No scleral icterus.       Right eye: No discharge.        Left eye: No discharge.     Conjunctiva/sclera: Conjunctivae normal.  Neck:     Trachea: No tracheal deviation.  Cardiovascular:     Rate and Rhythm: Normal rate and regular rhythm.  Pulmonary:     Effort: Pulmonary effort is normal. No respiratory distress.     Breath sounds: Normal breath sounds. No stridor. No wheezing or rales.  Abdominal:     General: Bowel sounds are normal. There is no distension.     Palpations: Abdomen is soft.     Tenderness: There is no abdominal tenderness. There is no  guarding or rebound.  Genitourinary:    Comments: No blood noted on rectal exam, no mass appreciated Musculoskeletal:        General: No tenderness or deformity.     Cervical back: Neck supple.  Skin:    General: Skin is warm and dry.     Findings: No rash.  Neurological:     General: No focal deficit present.     Mental Status: He is alert.     Cranial Nerves: No cranial nerve deficit, dysarthria or facial asymmetry.     Sensory: No sensory deficit.     Motor: No abnormal muscle tone or seizure activity.     Coordination: Coordination normal.  Psychiatric:        Mood and Affect: Mood normal.     ED Results / Procedures / Treatments   Labs (all labs ordered are listed, but only abnormal results are displayed) Labs Reviewed  COMPREHENSIVE METABOLIC PANEL - Abnormal; Notable for the following components:      Result Value   CO2 21 (*)    Alkaline Phosphatase 34 (*)  All other components within normal limits  CBC - Abnormal; Notable for the following components:   Hemoglobin 9.5 (*)    HCT 34.2 (*)    MCV 72.2 (*)    MCH 20.0 (*)    MCHC 27.8 (*)    RDW 17.2 (*)    All other components within normal limits  RETICULOCYTES - Abnormal; Notable for the following components:   RBC. 4.15 (*)    Immature Retic Fract 17.4 (*)    All other components within normal limits  VITAMIN B12  FOLATE  IRON AND TIBC  FERRITIN  POC OCCULT BLOOD, ED  I-STAT BETA HCG BLOOD, ED ([facility], [facility], [facility] ONLY)  TYPE AND SCREEN    EKG EKG Interpretation  Date/Time:  Tuesday May 10 2022 19:31:14 EDT Ventricular Rate:  55 PR Interval:  183 QRS Duration: 90 QT Interval:  412 QTC Calculation: 394 R Axis:   63 Text Interpretation: Sinus rhythm Consider left atrial enlargement Confirmed by Linwood Dibbles [phone]) on 05/10/2022 7:35:35 PM  Radiology DG Chest 2 View  Result Date: 05/10/2022 CLINICAL DATA:  Cough and shortness of breath EXAM: CHEST - 2 VIEW COMPARISON:  Chest x-ray 03/17/2019 FINDINGS: The  heart size and mediastinal contours are within normal limits. Both lungs are clear. The visualized skeletal structures are unremarkable. IMPRESSION: No active cardiopulmonary disease. Electronically Signed   By: Darliss Cheney M.D.   On: 05/10/2022 20:23    Procedures Procedures    Medications Ordered in ED Medications - No data to display  ED Course/ Medical Decision Making/ A&P Clinical Course as of 05/10/22 2214  Tue May 10, 2022  1953 CBC(!) Hemoglobin decreased at 9.5 [JK]  2040 Metabolic panel normal. [JK]  2040 Fecal occult negative [JK]  2040 X-ray without acute findings [JK]  2212 Case discussed with Dr. Haroldine Laws [JK]  2214 Secure chat consult message sent to Dr Meridee Score, GI [JK]    Clinical Course User Index [JK] Linwood Dibbles, MD                             Medical Decision Making Problems Addressed: Anemia, unspecified type: acute illness or injury that poses a threat to life or bodily functions Rectal bleeding: acute illness or injury that poses a threat to life or bodily functions  Amount and/or Complexity of Data Reviewed Labs: ordered. Decision-making details documented in ED Course. Radiology: ordered and independent interpretation performed.  Risk Decision regarding hospitalization.   Patient presented to the ED for evaluation of rectal bleeding.  Patient states he has had recurrent episodes recently.  He has now started to feel lightheaded and weak.  No gross blood noted on rectal exam at this time.  Patient's blood test however do show decreased hemoglobin with low MCV.  Anemia panel sent off.  Metabolic panel unremarkable.  Chest x-ray without evidence of pneumonia.  Possible his anemia is contributing to his symptoms of fatigue.  Patient does not require blood transfusion at this time but with his new onset anemia and his complaints of rectal bleeding I will consult with medical service for admission.  I will also request GI consultation for GI  bleeding.        Final Clinical Impression(s) / ED Diagnoses Final diagnoses:  Rectal bleeding  Anemia, unspecified type    Rx / DC Orders ED Discharge Orders     None         Linwood Dibbles, MD 05/10/22 2214

## 2022-05-11 DIAGNOSIS — D62 Acute posthemorrhagic anemia: Secondary | ICD-10-CM | POA: Diagnosis present

## 2022-05-11 DIAGNOSIS — K648 Other hemorrhoids: Secondary | ICD-10-CM | POA: Diagnosis not present

## 2022-05-11 DIAGNOSIS — D509 Iron deficiency anemia, unspecified: Secondary | ICD-10-CM

## 2022-05-11 DIAGNOSIS — K625 Hemorrhage of anus and rectum: Secondary | ICD-10-CM | POA: Diagnosis not present

## 2022-05-11 DIAGNOSIS — K922 Gastrointestinal hemorrhage, unspecified: Secondary | ICD-10-CM

## 2022-05-11 DIAGNOSIS — K5730 Diverticulosis of large intestine without perforation or abscess without bleeding: Secondary | ICD-10-CM | POA: Diagnosis not present

## 2022-05-11 LAB — HEMOGLOBIN AND HEMATOCRIT, BLOOD
HCT: 29 % — ABNORMAL LOW (ref 39.0–52.0)
HCT: 31.3 % — ABNORMAL LOW (ref 39.0–52.0)
HCT: 31.6 % — ABNORMAL LOW (ref 39.0–52.0)
HCT: 32.5 % — ABNORMAL LOW (ref 39.0–52.0)
Hemoglobin: 8.9 g/dL — ABNORMAL LOW (ref 13.0–17.0)
Hemoglobin: 8.9 g/dL — ABNORMAL LOW (ref 13.0–17.0)
Hemoglobin: 9.1 g/dL — ABNORMAL LOW (ref 13.0–17.0)
Hemoglobin: 9.6 g/dL — ABNORMAL LOW (ref 13.0–17.0)

## 2022-05-11 LAB — PROTIME-INR
INR: 1.2 (ref 0.8–1.2)
Prothrombin Time: 15.8 seconds — ABNORMAL HIGH (ref 11.4–15.2)

## 2022-05-11 LAB — RETIC PANEL
Immature Retic Fract: 26.6 % — ABNORMAL HIGH (ref 2.3–15.9)
RBC.: 4.46 MIL/uL (ref 4.22–5.81)
Retic Count, Absolute: 50.8 10*3/uL (ref 19.0–186.0)
Retic Ct Pct: 1.1 % (ref 0.4–3.1)
Reticulocyte Hemoglobin: 22.7 pg — ABNORMAL LOW (ref >27.9)

## 2022-05-11 LAB — BASIC METABOLIC PANEL
Anion gap: 12 (ref 5–15)
BUN: 14 mg/dL (ref 6–20)
CO2: 22 mmol/L (ref 22–32)
Calcium: 9.1 mg/dL (ref 8.9–10.3)
Chloride: 105 mmol/L (ref 98–111)
Creatinine, Ser: 1.19 mg/dL (ref 0.61–1.24)
GFR, Estimated: >60 mL/min (ref >60)
Glucose, Bld: 100 mg/dL — ABNORMAL HIGH (ref 70–99)
Potassium: 3.5 mmol/L (ref 3.5–5.1)
Sodium: 139 mmol/L (ref 135–145)

## 2022-05-11 MED ORDER — ACETAMINOPHEN 650 MG RE SUPP: 650.0000 mg | Freq: Four times a day (QID) | RECTAL | Status: DC | PRN

## 2022-05-11 MED ORDER — FLUTICASONE PROPIONATE 50 MCG/ACT NA SUSP
2.0000 | Freq: Every day | NASAL | Status: DC
  Administered 2022-05-11 – 2022-05-12 (×2): 2 via NASAL
  Filled 2022-05-11 (×2): qty 16

## 2022-05-11 MED ORDER — ONDANSETRON HCL 4 MG/2ML IJ SOLN
4.0000 mg | Freq: Four times a day (QID) | INTRAMUSCULAR | Status: DC | PRN
  Administered 2022-05-12: 4 mg via INTRAVENOUS
  Filled 2022-05-11: qty 2

## 2022-05-11 MED ORDER — PEG-KCL-NACL-NASULF-NA ASC-C 100 G PO SOLR: 1.0000 | Freq: Once | ORAL | Status: DC

## 2022-05-11 MED ORDER — ACETAMINOPHEN 325 MG PO TABS
650.0000 mg | ORAL_TABLET | Freq: Four times a day (QID) | ORAL | Status: DC | PRN
  Administered 2022-05-13: 650 mg via ORAL
  Filled 2022-05-11: qty 2

## 2022-05-11 MED ORDER — PEG-KCL-NACL-NASULF-NA ASC-C 100 G PO SOLR
0.5000 | Freq: Once | ORAL | Status: AC
  Administered 2022-05-12: 100 g via ORAL

## 2022-05-11 MED ORDER — LORATADINE 10 MG PO TABS
10.0000 mg | ORAL_TABLET | Freq: Every day | ORAL | Status: DC
  Administered 2022-05-11 – 2022-05-13 (×3): 10 mg via ORAL
  Filled 2022-05-11 (×3): qty 1

## 2022-05-11 MED ORDER — PEG-KCL-NACL-NASULF-NA ASC-C 100 G PO SOLR
0.5000 | Freq: Once | ORAL | Status: AC
  Administered 2022-05-11: 100 g via ORAL
  Filled 2022-05-11: qty 1

## 2022-05-11 MED ORDER — ONDANSETRON HCL 4 MG PO TABS: 4.0000 mg | ORAL_TABLET | Freq: Four times a day (QID) | ORAL | Status: DC | PRN

## 2022-05-11 MED ORDER — SODIUM CHLORIDE 0.9 % IV SOLN
250.0000 mg | Freq: Every day | INTRAVENOUS | Status: DC
  Administered 2022-05-11 – 2022-05-12 (×2): 250 mg via INTRAVENOUS
  Filled 2022-05-11 (×2): qty 20

## 2022-05-11 NOTE — Progress Notes (Signed)
Consulted for IV iron replacement. About 1000mg  deficit.   Ferrlecit 250mg  IV q24 x4 doses  Ulyses Southward, PharmD, [place], AAHIVP, CPP Infectious Disease Pharmacist 05/11/2022 4:09 PM

## 2022-05-11 NOTE — Progress Notes (Addendum)
PROGRESS NOTE    John Mccarty  VOZ:[id number] DOB: [birth year] DOA: 05/10/2022 PCP: Pcp, No    Chief Complaint  Patient presents with   GI Bleeding    Brief Narrative:  Patient is a 52 year old gentleman presented to the ED with complaints of shortness of breath, noted to have rectal bleeding intermittently for the past 8 years which he attributed to external hemorrhoids, but over the past 4 days has been having increasing hematochezia, endorses lightheadedness and dizziness.  FOBT done in the ED was negative.  Hemoglobin noted on presentation at 9.5 with repeat at 8.9.  Anemia panel done consistent with iron deficiency anemia.  Patient seen in consultation by gastroenterology and patient for colonoscopy tomorrow 05/12/2022.   Assessment & Plan:   Principal Problem:   GI bleed Active Problems:   Iron deficiency anemia   Rectal bleeding   Acute blood loss anemia   #1 GI bleed/acute blood loss anemia/iron deficiency anemia -Patient with rectal bleeding, noted to be symptomatic with lightheadedness dizziness and some shortness of breath. -Patient with no bowel movement today. -Some clinical improvement. -Hemoglobin at 8.9 this morning. -Anemia panel with iron of 21, ferritin of 3, folate of 15.2, TIBC of 490. -Patient seen in consultation by gastroenterology, has been placed on clear liquids, patient for colonoscopy tomorrow 05/12/2022. -Continue serial CBCs, transfusion threshold hemoglobin < 7. -Will likely need IV iron during his hospitalization.    DVT prophylaxis: SCDs Code Status: Full Family Communication: Updated patient.  No family at bedside. Disposition: Home Home when clinically improved, hemoglobin stabilized with no further bleeding, when cleared by GI.  Status is: Observation The patient remains OBS appropriate and will d/c before 2 midnights.   Consultants:  Gastroenterology: Dr. Rhea Belton 05/11/2022   Procedures:  Chest x-ray 05/10/2022   Antimicrobials:   Anti-infectives (From admission, onward)    None          Subjective: Patient laying in bed.  States as long as he has nasal cannula in he feels fine once nasal cannula is removed nasal air passages feel dry.  Feels some improvement with shortness of breath on admission as well as weakness and dizziness.  States has not had a bowel movement today.  Started on clears today.  Objective: Vitals:   05/11/22 0523 05/11/22 0825 05/11/22 1550 05/11/22 2016  BP: 124/81 119/72 128/71 106/64  Pulse: 62 (!) 58  61  Resp: 18 17 17    Temp: 98.2 F (36.8 C) 97.7 F (36.5 C) 98.8 F (37.1 C) 98.3 F (36.8 C)  TempSrc:  Oral Oral Oral  SpO2: 100% 100% 100% 100%  Weight:      Height:       No intake or output data in the 24 hours ending 05/11/22 2039 Filed Weights   05/10/22 1823  Weight: 87.1 kg    Examination:  General exam: Appears calm and comfortable  Respiratory system: Clear to auscultation.  No wheezes, no crackles, no rhonchi.  Fair air movement.  Speaking in full sentences.  Respiratory effort normal. Cardiovascular system: S1 & S2 heard, RRR. No JVD, murmurs, rubs, gallops or clicks. No pedal edema. Gastrointestinal system: Abdomen is soft, nondistended, positive bowel sounds.  Some tenderness to palpation in right lower quadrant and left lower quadrant.  No rebound.  No guarding.  Central nervous system: Alert and oriented. No focal neurological deficits. Extremities: Symmetric 5 x 5 power. Skin: No rashes, lesions or ulcers Psychiatry: Judgement and insight appear normal. Mood & affect appropriate.  Data Reviewed: I have personally reviewed following labs and imaging studies  CBC: Recent Labs  Lab 05/10/22 1834 05/10/22 2346 05/11/22 0603 05/11/22 1027 05/11/22 1623  WBC 6.4  --   --   --   --   HGB 9.5* 8.9* 8.9* 9.1* 9.6*  HCT 34.2* 31.6* 29.0* 31.3* 32.5*  MCV 72.2*  --   --   --   --   PLT 343  --   --   --   --     Basic Metabolic Panel: Recent  Labs  Lab 05/10/22 1834 05/11/22 0603  NA 140 139  K 3.5 3.5  CL 108 105  CO2 21* 22  GLUCOSE 90 100*  BUN 12 14  CREATININE 1.23 1.19  CALCIUM 9.2 9.1    GFR: Estimated Creatinine Clearance: 79.1 mL/min (by C-G formula based on SCr of 1.19 mg/dL).  Liver Function Tests: Recent Labs  Lab 05/10/22 1834  AST 21  ALT 18  ALKPHOS 34*  BILITOT 0.5  PROT 7.9  ALBUMIN 4.4    CBG: No results for input(s): "GLUCAP" in the last 168 hours.   No results found for this or any previous visit (from the past 240 hour(s)).       Radiology Studies: DG Chest 2 View  Result Date: 05/10/2022 CLINICAL DATA:  Cough and shortness of breath EXAM: CHEST - 2 VIEW COMPARISON:  Chest x-ray 03/17/2019 FINDINGS: The heart size and mediastinal contours are within normal limits. Both lungs are clear. The visualized skeletal structures are unremarkable. IMPRESSION: No active cardiopulmonary disease. Electronically Signed   By: Darliss Cheney M.D.   On: 05/10/2022 20:23        Scheduled Meds:  fluticasone  2 spray Each Nare Daily   loratadine  10 mg Oral Daily   [START ON 05/12/2022] peg 3350 powder  0.5 kit Oral Once   Continuous Infusions:  ferric gluconate (FERRLECIT) IVPB 250 mg (05/11/22 1832)     LOS: 0 days    Time spent: 35 minutes    Ramiro Harvest, MD [facility]   To contact the attending provider between 7A-7P or the covering provider during after hours 7P-7A, please log into the web site [email or web address] and access using universal [place] password for that web site. If you do not have the password, please call the hospital operator.  05/11/2022, 8:39 PM

## 2022-05-11 NOTE — H&P (View-Only) (Signed)
     Consultation  Referring Provider:   Dr. Thompson [facility] Primary Care Physician:  Pcp, No Primary Gastroenterologist:  Unassigned       Reason for Consultation:     Rectal bleeding and anemia         HPI:   John Mccarty is a 51 y.o. male with past medical history significant for history of abdominal surgery, hemorrhoids presents with shortness of breath found to have hemoglobin 9.5 FOBT negative stools.  Patient states 2022 went to [place] office approximately 1 and was scheduled for colonoscopy however this was never completed. Has 2 charts, needed to be merged. Complains of rectal bleeding for the past 8 years was told that he had external and internal hemorrhoids and was referred to [facility] at 1 point but never went. No family was present at bedside during evaluation. Patient states he has a bowel movement 2-3 times a day, can be loose to formed.  Normally has bleeding at least once a day with associated bowel movement bright red blood.  States about 2 days ago he had 7 loose bowel movements with large volume bleeding with associated weakness, and then 2 days of shortness of breath without chest pain which prompted him to come to the ER. Patient has occasional rectal pain if he has frequent bowel movements.  Has hemorrhoids that fall out and has to push back in. Patient denies family history of colon cancer or IBD. Denies any upper GI symptoms such as nausea, vomiting, GERD, dysphagia. Denies any associated abdominal pain. Denies any weight loss. Denies fever but has had some chills in the last 2 days. He denies rashes or joint pain. Patient denies smoking cigarettes but does smoke marijuana 2 times a week, denies alcohol use, NSAID use. He is not on blood thinner  Abnormal ED labs: Abnormal Labs Reviewed  COMPREHENSIVE METABOLIC PANEL - Abnormal; Notable for the following components:      Result Value   CO2 21 (*)    Alkaline Phosphatase 34 (*)    All other components within  normal limits  CBC - Abnormal; Notable for the following components:   Hemoglobin 9.5 (*)    HCT 34.2 (*)    MCV 72.2 (*)    MCH 20.0 (*)    MCHC 27.8 (*)    RDW 17.2 (*)    All other components within normal limits  IRON AND TIBC - Abnormal; Notable for the following components:   Iron 21 (*)    TIBC 490 (*)    Saturation Ratios 4 (*)    All other components within normal limits  FERRITIN - Abnormal; Notable for the following components:   Ferritin 3 (*)    All other components within normal limits  RETICULOCYTES - Abnormal; Notable for the following components:   RBC. 4.15 (*)    Immature Retic Fract 17.4 (*)    All other components within normal limits  RETIC PANEL - Abnormal; Notable for the following components:   Immature Retic Fract 26.6 (*)    Reticulocyte Hemoglobin 22.7 (*)    All other components within normal limits  HEMOGLOBIN AND HEMATOCRIT, BLOOD - Abnormal; Notable for the following components:   Hemoglobin 8.9 (*)    HCT 31.6 (*)    All other components within normal limits  HEMOGLOBIN AND HEMATOCRIT, BLOOD - Abnormal; Notable for the following components:   Hemoglobin 8.9 (*)    HCT 29.0 (*)    All other components within normal limits    BASIC METABOLIC PANEL - Abnormal; Notable for the following components:   Glucose, Bld 100 (*)    All other components within normal limits  PROTIME-INR - Abnormal; Notable for the following components:   Prothrombin Time 15.8 (*)    All other components within normal limits     History reviewed. No pertinent past medical history.  Surgical History:  He  has a past surgical history that includes Abdominal surgery. Family History:  His family history is not on file. Social History:   reports that he has never smoked. He has never used smokeless tobacco. He reports current alcohol use. He reports current drug use. Drug: Marijuana.  Prior to Admission medications   Medication Sig Start Date End Date Taking?  Authorizing Provider  Pseudoeph-Doxylamine-DM-APAP (NYQUIL PO) Take 30 mLs by mouth daily as needed (For cold symptoms).   Yes [provider]    Current Facility-Administered Medications  Medication Dose Route Frequency Provider Last Rate Last Admin   acetaminophen (TYLENOL) tablet 650 mg  650 mg Oral Q6H PRN Crosley, Debby, MD       Or   acetaminophen (TYLENOL) suppository 650 mg  650 mg Rectal Q6H PRN Crosley, Debby, MD       ondansetron (ZOFRAN) tablet 4 mg  4 mg Oral Q6H PRN Crosley, Debby, MD       Or   ondansetron (ZOFRAN) injection 4 mg  4 mg Intravenous Q6H PRN Crosley, Debby, MD        Allergies as of 05/10/2022   (No Known Allergies)    Review of Systems:    Constitutional: No weight loss, fever, chills, weakness or fatigue HEENT: Eyes: No change in vision               Ears, Nose, Throat:  No change in hearing or congestion Skin: No rash or itching Cardiovascular: No chest pain, chest pressure or palpitations   Respiratory: No SOB or cough Gastrointestinal: See HPI and otherwise negative Genitourinary: No dysuria or change in urinary frequency Neurological: No headache, dizziness or syncope Musculoskeletal: No new muscle or joint pain Hematologic: No bleeding or bruising Psychiatric: No history of depression or anxiety     Physical Exam:  Vital signs in last 24 hours: Temp:  [97.8 F (36.6 C)-99.2 F (37.3 C)] 98.2 F (36.8 C) (05/08 0523) Pulse Rate:  [45-62] 62 (05/08 0523) Resp:  [18-24] 18 (05/08 0523) BP: (114-127)/(71-81) 124/81 (05/08 0523) SpO2:  [100 %] 100 % (05/08 0523) Weight:  [87.1 kg] 87.1 kg (05/07 1823) Last BM Date : 05/10/22 Last BM recorded by nurses in past 5 days No data recorded  General:   Pleasant, well developed male in no acute distress Head:  Normocephalic and atraumatic. Eyes: sclerae anicteric,conjunctive pink  Heart:  regular rate and rhythm Pulm: Clear anteriorly; no wheezing Abdomen:  Soft, Non-distended AB,  Active bowel sounds. mild tenderness in the RLQ. Without guarding and Without rebound, No organomegaly appreciated. Rectal declines Extremities:  Without edema. Msk:  Symmetrical without gross deformities. Peripheral pulses intact.  Neurologic:  Alert and  oriented x4;  No focal deficits.  Skin:   Dry and intact without significant lesions or rashes. Psychiatric:  Cooperative. Normal mood and affect.  LAB RESULTS: Recent Labs    05/10/22 1834 05/10/22 2346 05/11/22 0603  WBC 6.4  --   --   HGB 9.5* 8.9* 8.9*  HCT 34.2* 31.6* 29.0*  PLT 343  --   --    BMET Recent Labs      05/10/22 1834 05/11/22 0603  NA 140 139  K 3.5 3.5  CL 108 105  CO2 21* 22  GLUCOSE 90 100*  BUN 12 14  CREATININE 1.23 1.19  CALCIUM 9.2 9.1   LFT Recent Labs    05/10/22 1834  PROT 7.9  ALBUMIN 4.4  AST 21  ALT 18  ALKPHOS 34*  BILITOT 0.5   PT/INR Recent Labs    05/11/22 0603  LABPROT 15.8*  INR 1.2    STUDIES: DG Chest 2 View  Result Date: 05/10/2022 CLINICAL DATA:  Cough and shortness of breath EXAM: CHEST - 2 VIEW COMPARISON:  Chest x-ray 03/17/2019 FINDINGS: The heart size and mediastinal contours are within normal limits. Both lungs are clear. The visualized skeletal structures are unremarkable. IMPRESSION: No active cardiopulmonary disease. Electronically Signed   By: Amy  Guttmann M.D.   On: 05/10/2022 20:23      Impression/Plan   51-year-old male with 8-year history of rectal bleeding increasing over the last several months presents with symptomatic anemia found to have hemoglobin 9.6.   Interestingly negative FOBT stools in the ER. Patient sounds like 2 days ago he had 7 bowel movements with large volume likely had more of acute on chronic anemia with associated shortness of breath and weakness which brought him in, possible this is hemorrhoids, fissure, but need to rule out inflammatory bowel disease with mild right lower quadrant discomfort and malignancy.  Patient had HGB  15.6 2021.  iron 21, ferritin 3 HGB 8.9 MCV 72.2 Platelets 343  Iron 21 Ferritin 3 B12 519  -Moviprep, clear liquid diet, NPO at 5 AM -Will plan for  Colonoscopy tomorrow with Dr. Pyrtle for symptomatic anemia and rectal bleeding --Continue to monitor H&H with transfusion as needed to maintain hemoglobin greater than 7.    Principal Problem:   GI bleed Active Problems:   Iron deficiency anemia    LOS: 0 days    Thank you for your kind consultation, we will continue to follow.   Taralynn Quiett R Leeta Grimme  05/11/2022, 8:16 AM  

## 2022-05-11 NOTE — Plan of Care (Signed)

## 2022-05-11 NOTE — Consult Note (Addendum)
Consultation  Referring Provider:   Dr. Janee Morn [facility] Primary Care Physician:  Pcp, No Primary Gastroenterologist:  Gentry Fitz       Reason for Consultation:     Rectal bleeding and anemia         HPI:   John Mccarty is a 52 y.o. male with past medical history significant for history of abdominal surgery, hemorrhoids presents with shortness of breath found to have hemoglobin 9.5 FOBT negative stools.  Patient states 2022 went to [place] office approximately 1 and was scheduled for colonoscopy however this was never completed. Has 2 charts, needed to be merged. Complains of rectal bleeding for the past 8 years was told that he had external and internal hemorrhoids and was referred to [facility] at 1 point but never went. No family was present at bedside during evaluation. Patient states he has a bowel movement 2-3 times a day, can be loose to formed.  Normally has bleeding at least once a day with associated bowel movement bright red blood.  States about 2 days ago he had 7 loose bowel movements with large volume bleeding with associated weakness, and then 2 days of shortness of breath without chest pain which prompted him to come to the ER. Patient has occasional rectal pain if he has frequent bowel movements.  Has hemorrhoids that fall out and has to push back in. Patient denies family history of colon cancer or IBD. Denies any upper GI symptoms such as nausea, vomiting, GERD, dysphagia. Denies any associated abdominal pain. Denies any weight loss. Denies fever but has had some chills in the last 2 days. He denies rashes or joint pain. Patient denies smoking cigarettes but does smoke marijuana 2 times a week, denies alcohol use, NSAID use. He is not on blood thinner  Abnormal ED labs: Abnormal Labs Reviewed  COMPREHENSIVE METABOLIC PANEL - Abnormal; Notable for the following components:      Result Value   CO2 21 (*)    Alkaline Phosphatase 34 (*)    All other components within  normal limits  CBC - Abnormal; Notable for the following components:   Hemoglobin 9.5 (*)    HCT 34.2 (*)    MCV 72.2 (*)    MCH 20.0 (*)    MCHC 27.8 (*)    RDW 17.2 (*)    All other components within normal limits  IRON AND TIBC - Abnormal; Notable for the following components:   Iron 21 (*)    TIBC 490 (*)    Saturation Ratios 4 (*)    All other components within normal limits  FERRITIN - Abnormal; Notable for the following components:   Ferritin 3 (*)    All other components within normal limits  RETICULOCYTES - Abnormal; Notable for the following components:   RBC. 4.15 (*)    Immature Retic Fract 17.4 (*)    All other components within normal limits  RETIC PANEL - Abnormal; Notable for the following components:   Immature Retic Fract 26.6 (*)    Reticulocyte Hemoglobin 22.7 (*)    All other components within normal limits  HEMOGLOBIN AND HEMATOCRIT, BLOOD - Abnormal; Notable for the following components:   Hemoglobin 8.9 (*)    HCT 31.6 (*)    All other components within normal limits  HEMOGLOBIN AND HEMATOCRIT, BLOOD - Abnormal; Notable for the following components:   Hemoglobin 8.9 (*)    HCT 29.0 (*)    All other components within normal limits  BASIC METABOLIC PANEL - Abnormal; Notable for the following components:   Glucose, Bld 100 (*)    All other components within normal limits  PROTIME-INR - Abnormal; Notable for the following components:   Prothrombin Time 15.8 (*)    All other components within normal limits     History reviewed. No pertinent past medical history.  Surgical History:  He  has a past surgical history that includes Abdominal surgery. Family History:  His family history is not on file. Social History:   reports that he has never smoked. He has never used smokeless tobacco. He reports current alcohol use. He reports current drug use. Drug: Marijuana.  Prior to Admission medications   Medication Sig Start Date End Date Taking?  Authorizing Provider  Pseudoeph-Doxylamine-DM-APAP (NYQUIL PO) Take 30 mLs by mouth daily as needed (For cold symptoms).   Yes [provider]    Current Facility-Administered Medications  Medication Dose Route Frequency Provider Last Rate Last Admin   acetaminophen (TYLENOL) tablet 650 mg  650 mg Oral Q6H PRN Crosley, Debby, MD       Or   acetaminophen (TYLENOL) suppository 650 mg  650 mg Rectal Q6H PRN Crosley, Debby, MD       ondansetron (ZOFRAN) tablet 4 mg  4 mg Oral Q6H PRN Joneen Roach, Debby, MD       Or   ondansetron (ZOFRAN) injection 4 mg  4 mg Intravenous Q6H PRN Gery Pray, MD        Allergies as of 05/10/2022   (No Known Allergies)    Review of Systems:    Constitutional: No weight loss, fever, chills, weakness or fatigue HEENT: Eyes: No change in vision               Ears, Nose, Throat:  No change in hearing or congestion Skin: No rash or itching Cardiovascular: No chest pain, chest pressure or palpitations   Respiratory: No SOB or cough Gastrointestinal: See HPI and otherwise negative Genitourinary: No dysuria or change in urinary frequency Neurological: No headache, dizziness or syncope Musculoskeletal: No new muscle or joint pain Hematologic: No bleeding or bruising Psychiatric: No history of depression or anxiety     Physical Exam:  Vital signs in last 24 hours: Temp:  [97.8 F (36.6 C)-99.2 F (37.3 C)] 98.2 F (36.8 C) (05/08 0523) Pulse Rate:  [45-62] 62 (05/08 0523) Resp:  [18-24] 18 (05/08 0523) BP: (114-127)/(71-81) 124/81 (05/08 0523) SpO2:  [100 %] 100 % (05/08 0523) Weight:  [87.1 kg] 87.1 kg (05/07 1823) Last BM Date : 05/10/22 Last BM recorded by nurses in past 5 days No data recorded  General:   Pleasant, well developed male in no acute distress Head:  Normocephalic and atraumatic. Eyes: sclerae anicteric,conjunctive pink  Heart:  regular rate and rhythm Pulm: Clear anteriorly; no wheezing Abdomen:  Soft, Non-distended AB,  Active bowel sounds. mild tenderness in the RLQ. Without guarding and Without rebound, No organomegaly appreciated. Rectal declines Extremities:  Without edema. Msk:  Symmetrical without gross deformities. Peripheral pulses intact.  Neurologic:  Alert and  oriented x4;  No focal deficits.  Skin:   Dry and intact without significant lesions or rashes. Psychiatric:  Cooperative. Normal mood and affect.  LAB RESULTS: Recent Labs    05/10/22 1834 05/10/22 2346 05/11/22 0603  WBC 6.4  --   --   HGB 9.5* 8.9* 8.9*  HCT 34.2* 31.6* 29.0*  PLT 343  --   --    BMET Recent Labs  05/10/22 1834 05/11/22 0603  NA 140 139  K 3.5 3.5  CL 108 105  CO2 21* 22  GLUCOSE 90 100*  BUN 12 14  CREATININE 1.23 1.19  CALCIUM 9.2 9.1   LFT Recent Labs    05/10/22 1834  PROT 7.9  ALBUMIN 4.4  AST 21  ALT 18  ALKPHOS 34*  BILITOT 0.5   PT/INR Recent Labs    05/11/22 0603  LABPROT 15.8*  INR 1.2    STUDIES: DG Chest 2 View  Result Date: 05/10/2022 CLINICAL DATA:  Cough and shortness of breath EXAM: CHEST - 2 VIEW COMPARISON:  Chest x-ray 03/17/2019 FINDINGS: The heart size and mediastinal contours are within normal limits. Both lungs are clear. The visualized skeletal structures are unremarkable. IMPRESSION: No active cardiopulmonary disease. Electronically Signed   By: Darliss Cheney M.D.   On: 05/10/2022 20:23      Impression/Plan   52 year old male with 8-year history of rectal bleeding increasing over the last several months presents with symptomatic anemia found to have hemoglobin 9.6.   Interestingly negative FOBT stools in the ER. Patient sounds like 2 days ago he had 7 bowel movements with large volume likely had more of acute on chronic anemia with associated shortness of breath and weakness which brought him in, possible this is hemorrhoids, fissure, but need to rule out inflammatory bowel disease with mild right lower quadrant discomfort and malignancy.  Patient had HGB  15.6 2021.  iron 21, ferritin 3 HGB 8.9 MCV 72.2 Platelets 343  Iron 21 Ferritin 3 B12 519  -Moviprep, clear liquid diet, NPO at 5 AM -Will plan for  Colonoscopy tomorrow with Dr. Rhea Belton for symptomatic anemia and rectal bleeding --Continue to monitor H&H with transfusion as needed to maintain hemoglobin greater than 7.    Principal Problem:   GI bleed Active Problems:   Iron deficiency anemia    LOS: 0 days    Thank you for your kind consultation, we will continue to follow.   Doree Albee  05/11/2022, 8:16 AM

## 2022-05-11 NOTE — ED Notes (Signed)
ED TO INPATIENT HANDOFF REPORT  ED Nurse Name and Phone #: [phone]  S Name/Age/Gender John Mccarty 52 y.o. male Room/Bed: 024C/024C  Code Status   Code Status: Not on file  Home/SNF/Other Home Patient oriented to: self, place, time, and situation Is this baseline? Yes   Triage Complete: Triage complete  Chief Complaint GI bleed [K92.2]  Triage Note Pt c/o rectal bleeding started yesterday. Pt states he has been having a lot of red bleeding every time he has a BM. Pt states the blood has been "all over the place" since yesterday. Pt c/o SOB started two days ago. Pt able to speak in complete sentences. Pt denies abd pain. Pt c/o dizziness and weakness started yesterday.    Allergies No Known Allergies  Level of Care/Admitting Diagnosis ED Disposition     ED Disposition  Admit   Condition  --   Comment  Hospital Area: [facility] [[id number]]  Level of Care: Telemetry Medical [104]  May place patient in observation at [facility] or [facility] if equivalent level of care is available:: No  Covid Evaluation: Confirmed COVID Negative  Diagnosis: GI bleed [308657]  Admitting Physician: Gery Pray [[id number]]  Attending Physician: Gery Pray [[id number]]          B Medical/Surgery History History reviewed. No pertinent past medical history. Past Surgical History:  Procedure Laterality Date   ABDOMINAL SURGERY       A IV Location/Drains/Wounds Patient Lines/Drains/Airways Status     Active Line/Drains/Airways     Name Placement date Placement time Site Days   Peripheral IV 05/10/22 20 G 1.16" Anterior;Proximal;Right Forearm 05/10/22  2007  Forearm  1            Intake/Output Last 24 hours No intake or output data in the 24 hours ending 05/11/22 0052  Labs/Imaging Results for orders placed or performed during the hospital encounter of 05/10/22 (from the past 48 hour(s))  Comprehensive metabolic panel     Status: Abnormal   Collection  Time: 05/10/22  6:34 PM  Result Value Ref Range   Sodium 140 135 - 145 mmol/L   Potassium 3.5 3.5 - 5.1 mmol/L   Chloride 108 98 - 111 mmol/L   CO2 21 (L) 22 - 32 mmol/L   Glucose, Bld 90 70 - 99 mg/dL    Comment: Glucose reference range applies only to samples taken after fasting for at least 8 hours.   BUN 12 6 - 20 mg/dL   Creatinine, Ser 8.46 0.61 - 1.24 mg/dL   Calcium 9.2 8.9 - 96.2 mg/dL   Total Protein 7.9 6.5 - 8.1 g/dL   Albumin 4.4 3.5 - 5.0 g/dL   AST 21 15 - 41 U/L   ALT 18 0 - 44 U/L   Alkaline Phosphatase 34 (L) 38 - 126 U/L   Total Bilirubin 0.5 0.3 - 1.2 mg/dL   GFR, Estimated >95 >28 mL/min    Comment: (NOTE) Calculated using the CKD-EPI Creatinine Equation (2021)    Anion gap 11 5 - 15    Comment: Performed at [facility], [address] [place], [place] [address]  CBC     Status: Abnormal   Collection Time: 05/10/22  6:34 PM  Result Value Ref Range   WBC 6.4 4.0 - 10.5 K/uL   RBC 4.74 4.22 - 5.81 MIL/uL   Hemoglobin 9.5 (L) 13.0 - 17.0 g/dL   HCT 40.1 (L) 02.7 - 25.3 %   MCV 72.2 (L)  80.0 - 100.0 fL   MCH 20.0 (L) 26.0 - 34.0 pg   MCHC 27.8 (L) 30.0 - 36.0 g/dL   RDW 16.1 (H) 09.6 - 04.5 %   Platelets 343 150 - 400 K/uL   nRBC 0.0 0.0 - 0.2 %    Comment: Performed at [facility], [address] [place], [place] [address]  Type and screen [facility]     Status: None   Collection Time: 05/10/22  6:34 PM  Result Value Ref Range   ABO/RH(D) B POS    Antibody Screen NEG    Sample Expiration      05/13/2022,2359 Performed at [facility], [address] [place], [place] [address]   I-Stat beta hCG blood, ED     Status: None   Collection Time: 05/10/22  6:55 PM  Result Value Ref Range   I-stat hCG, quantitative <5.0 <5 mIU/mL   Comment 3            Comment:   GEST. AGE      CONC.  (mIU/mL)   <=1 WEEK        5 - 50     2 WEEKS       50 - 500     3 WEEKS       100 - 10,000     4 WEEKS     1,000 - 30,000         MALE AND NON-PREGNANT MALE:     LESS THAN 5 mIU/mL   POC occult blood, ED     Status: None   Collection Time: 05/10/22  7:26 PM  Result Value Ref Range   Fecal Occult Bld NEGATIVE NEGATIVE  Vitamin B12     Status: None   Collection Time: 05/10/22  9:50 PM  Result Value Ref Range   Vitamin B-12 519 180 - 914 pg/mL    Comment: (NOTE) This assay is not validated for testing neonatal or myeloproliferative syndrome specimens for Vitamin B12 levels. Performed at [facility], [address], [place], [place] [address]   Folate     Status: None   Collection Time: 05/10/22  9:50 PM  Result Value Ref Range   Folate 15.2 >5.9 ng/mL    Comment: Performed at [facility], [address], [place], [place] [address]  Iron and TIBC     Status: Abnormal   Collection Time: 05/10/22  9:50 PM  Result Value Ref Range   Iron 21 (L) 45 - 182 ug/dL   TIBC 657 (H) 846 - 962 ug/dL   Saturation Ratios 4 (L) 17.9 - 39.5 %   UIBC 469 ug/dL    Comment: Performed at [facility], [address] [place], [place] [address]  Ferritin     Status: Abnormal   Collection Time: 05/10/22  9:50 PM  Result Value Ref Range   Ferritin 3 (L) 24 - 336 ng/mL    Comment: Performed at [facility], [address], [place], [place] [address]  Reticulocytes     Status: Abnormal   Collection Time: 05/10/22  9:50 PM  Result Value Ref Range   Retic Ct Pct 0.9 0.4 - 3.1 %   RBC. 4.15 (L) 4.22 - 5.81 MIL/uL   Retic Count, Absolute 37.4 19.0 - 186.0 K/uL   Immature Retic Fract 17.4 (H) 2.3 - 15.9 %    Comment: Performed at [facility], [address], [place], [place] [address]  Retic Panel     Status: Abnormal   Collection Time: 05/10/22 11:46 PM  Result Value Ref Range   Retic Ct Pct 1.1 0.4 - 3.1 %   RBC. 4.46 4.22 - 5.81 MIL/uL   Retic Count, Absolute 50.8 19.0 - 186.0 K/uL   Immature Retic Fract 26.6 (H) 2.3 - 15.9 %   Reticulocyte Hemoglobin 22.7 (L) >27.9 pg    Comment:         A RET-He < 28 pg is an indication of iron-deficient or iron- insufficient erythropoiesis. Patients with thalassemia may also have a decreased RET-He result unrelated to iron availability.     If this patient has chronic kidney disease and does not have a hemoglobinopathy he/she meets criteria for iron deficiency per the 2016 NICE guidelines. Refer to specific guidelines to determine the appropriate thresholds for treating CKD- associated iron deficiency. TSAT and ferritin should be used in patients with hemoglobinopathies (e.g. thalassemia). Performed at [facility], [address], [place], [place] [address]   Hemoglobin and hematocrit, blood     Status: Abnormal   Collection Time: 05/10/22 11:46 PM  Result Value Ref Range   Hemoglobin 8.9 (L) 13.0 - 17.0 g/dL   HCT 86.5 (L) 78.4 - 69.6 %    Comment: Performed at [facility], [address], [place], [place] [address]   DG Chest 2 View  Result Date: 05/10/2022 CLINICAL DATA:  Cough and shortness of breath EXAM: CHEST - 2 VIEW COMPARISON:  Chest x-ray 03/17/2019 FINDINGS: The heart size and mediastinal contours are within normal limits. Both lungs are clear. The visualized skeletal structures are unremarkable. IMPRESSION: No active cardiopulmonary disease. Electronically Signed   By: Darliss Cheney M.D.   On: 05/10/2022 20:23    Pending Labs Unresulted Labs (From admission, onward)     Start     Ordered   05/10/22 2257  Hemoglobin and hematocrit, blood  Now then every 6 hours,   R      05/10/22 2256            Vitals/Pain Today's Vitals   05/10/22 1822 05/10/22 1823 05/10/22 1930  BP: 127/77  120/71  Pulse: 62  62  Resp: 18  (!) 24  Temp: 99.2 F (37.3 C)  98.3 F (36.8 C)  TempSrc: Oral  Oral  SpO2: 100%  100%  Weight:  87.1 kg   Height:  5\' 7"  (1.702 m)   PainSc:  0-No pain     Isolation Precautions No active isolations  Medications Medications - No data to display  Mobility walks      Focused Assessments     R Recommendations: See Admitting Provider Note  Report given to:   Additional Notes:

## 2022-05-12 ENCOUNTER — Observation Stay (HOSPITAL_BASED_OUTPATIENT_CLINIC_OR_DEPARTMENT_OTHER): Payer: 59 | Admitting: Certified Registered Nurse Anesthetist

## 2022-05-12 ENCOUNTER — Encounter (HOSPITAL_COMMUNITY): Payer: Self-pay | Admitting: Family Medicine

## 2022-05-12 ENCOUNTER — Encounter (HOSPITAL_COMMUNITY)
Admission: EM | Disposition: A | Discharge status: Home / Self Care | Payer: Self-pay | Source: Home / Self Care | Attending: Emergency Medicine

## 2022-05-12 ENCOUNTER — Observation Stay (HOSPITAL_COMMUNITY): Payer: 59 | Admitting: Certified Registered Nurse Anesthetist

## 2022-05-12 DIAGNOSIS — K5730 Diverticulosis of large intestine without perforation or abscess without bleeding: Secondary | ICD-10-CM

## 2022-05-12 DIAGNOSIS — K625 Hemorrhage of anus and rectum: Secondary | ICD-10-CM | POA: Diagnosis not present

## 2022-05-12 DIAGNOSIS — K648 Other hemorrhoids: Secondary | ICD-10-CM

## 2022-05-12 DIAGNOSIS — D509 Iron deficiency anemia, unspecified: Secondary | ICD-10-CM

## 2022-05-12 HISTORY — PX: COLONOSCOPY WITH PROPOFOL: SHX5780

## 2022-05-12 LAB — BASIC METABOLIC PANEL
Anion gap: 7 (ref 5–15)
BUN: 13 mg/dL (ref 6–20)
CO2: 22 mmol/L (ref 22–32)
Calcium: 9.1 mg/dL (ref 8.9–10.3)
Chloride: 109 mmol/L (ref 98–111)
Creatinine, Ser: 1.26 mg/dL — ABNORMAL HIGH (ref 0.61–1.24)
GFR, Estimated: >60 mL/min (ref >60)
Glucose, Bld: 96 mg/dL (ref 70–99)
Potassium: 3.7 mmol/L (ref 3.5–5.1)
Sodium: 138 mmol/L (ref 135–145)

## 2022-05-12 LAB — CBC
HCT: 30.6 % — ABNORMAL LOW (ref 39.0–52.0)
Hemoglobin: 9 g/dL — ABNORMAL LOW (ref 13.0–17.0)
MCH: 20 pg — ABNORMAL LOW (ref 26.0–34.0)
MCHC: 29.4 g/dL — ABNORMAL LOW (ref 30.0–36.0)
MCV: 67.8 fL — ABNORMAL LOW (ref 80.0–100.0)
Platelets: 322 10*3/uL (ref 150–400)
RBC: 4.51 MIL/uL (ref 4.22–5.81)
RDW: 17.3 % — ABNORMAL HIGH (ref 11.5–15.5)
WBC: 6.4 10*3/uL (ref 4.0–10.5)
nRBC: 0 % (ref 0.0–0.2)

## 2022-05-12 LAB — HEMOGLOBIN AND HEMATOCRIT, BLOOD
HCT: 38.2 % — ABNORMAL LOW (ref 39.0–52.0)
Hemoglobin: 11.1 g/dL — ABNORMAL LOW (ref 13.0–17.0)

## 2022-05-12 LAB — MAGNESIUM: Magnesium: 1.9 mg/dL (ref 1.7–2.4)

## 2022-05-12 SURGERY — COLONOSCOPY WITH PROPOFOL
Anesthesia: Monitor Anesthesia Care

## 2022-05-12 MED ORDER — LACTATED RINGERS IV SOLN: INTRAVENOUS | Status: DC

## 2022-05-12 MED ORDER — SODIUM CHLORIDE 0.9 % IV SOLN: INTRAVENOUS | Status: DC

## 2022-05-12 MED ORDER — PROPOFOL 500 MG/50ML IV EMUL
INTRAVENOUS | Status: DC | PRN
  Administered 2022-05-12: 150 ug/kg/min via INTRAVENOUS

## 2022-05-12 MED ORDER — PROPOFOL 10 MG/ML IV BOLUS
INTRAVENOUS | Status: DC | PRN
  Administered 2022-05-12: 30 mg via INTRAVENOUS
  Administered 2022-05-12: 20 mg via INTRAVENOUS
  Administered 2022-05-12: 30 mg via INTRAVENOUS

## 2022-05-12 MED ORDER — SODIUM CHLORIDE 0.9 % IV SOLN
500.0000 mg | Freq: Once | INTRAVENOUS | Status: AC
  Administered 2022-05-13: 500 mg via INTRAVENOUS
  Filled 2022-05-12: qty 40

## 2022-05-12 MED ORDER — PHENYLEPHRINE 80 MCG/ML (10ML) SYRINGE FOR IV PUSH (FOR BLOOD PRESSURE SUPPORT)
PREFILLED_SYRINGE | INTRAVENOUS | Status: DC | PRN
  Administered 2022-05-12 (×2): 80 ug via INTRAVENOUS

## 2022-05-12 MED ORDER — LIDOCAINE 2% (20 MG/ML) 5 ML SYRINGE
INTRAMUSCULAR | Status: DC | PRN
  Administered 2022-05-12: 40 mg via INTRAVENOUS

## 2022-05-12 SURGICAL SUPPLY — 22 items

## 2022-05-12 NOTE — Interval H&P Note (Signed)
History and Physical Interval Note: For colonoscopy today to eval rectal bleeding. MAC     Latest Ref Rng & Units 05/12/2022    7:57 AM 05/11/2022    4:23 PM 05/11/2022   10:27 AM  CBC  WBC 4.0 - 10.5 K/uL 6.4     Hemoglobin 13.0 - 17.0 g/dL 9.0  9.6  9.1   Hematocrit 39.0 - 52.0 % 30.6  32.5  31.3   Platelets 150 - 400 K/uL 322       05/12/2022 10:08 AM  Pedrohenrique Kizzie Bane  has presented today for surgery, with the diagnosis of rectal bleeding, anemia.  The various methods of treatment have been discussed with the patient and family. After consideration of risks, benefits and other options for treatment, the patient has consented to  Procedure(s): COLONOSCOPY WITH PROPOFOL (N/A) as a surgical intervention.  The patient's history has been reviewed, patient examined, no change in status, stable for surgery.  I have reviewed the patient's chart and labs.  Questions were answered to the patient's satisfaction.     Carie Caddy Yarel Rushlow

## 2022-05-12 NOTE — Op Note (Signed)
[facility] Patient Name: John Mccarty Procedure Date : 05/12/2022 MRN: [id number] Attending MD: Beverley Fiedler , MD, [id number] Date of Birth: July 24, 1970 CSN: [id number] Age: 52 Admit Type: Inpatient Procedure:                Colonoscopy Indications:              Rectal bleeding, Iron deficiency anemia Providers:                Carie Caddy. Rhea Belton, MD, Glory Rosebush, RN, Harrington Challenger,                            Technician Referring MD:             [facility] Medicines:                Monitored Anesthesia Care Complications:            No immediate complications. Estimated Blood Loss:     Estimated blood loss: none. Procedure:                Pre-Anesthesia Assessment:                           - Prior to the procedure, a History and Physical                            was performed, and patient medications and                            allergies were reviewed. The patient's tolerance of                            previous anesthesia was also reviewed. The risks                            and benefits of the procedure and the sedation                            options and risks were discussed with the patient.                            All questions were answered, and informed consent                            was obtained. Prior Anticoagulants: The patient has                            taken no anticoagulant or antiplatelet agents. ASA                            Grade Assessment: II - A patient with mild systemic                            disease. After reviewing the risks and benefits,  the patient was deemed in satisfactory condition to                            undergo the procedure.                           After obtaining informed consent, the colonoscope                            was passed under direct vision. Throughout the                            procedure, the patient's blood pressure, pulse, and                             oxygen saturations were monitored continuously. The                            [id number]-HQ190L ([id number] Olympus coloscope was                            introduced through the anus and advanced to the                            terminal ileum. The colonoscopy was performed                            without difficulty. The patient tolerated the                            procedure well. The quality of the bowel                            preparation was excellent. The terminal ileum,                            ileocecal valve, appendiceal orifice, and rectum                            were photographed. Scope In: 10:40:58 AM Scope Out: 10:54:27 AM Scope Withdrawal Time: 0 hours 10 minutes 18 seconds  Total Procedure Duration: 0 hours 13 minutes 29 seconds  Findings:      The digital rectal exam was normal.      The terminal ileum appeared normal.      Multiple small-mouthed diverticula were found in the sigmoid colon.      Internal hemorrhoids were found during retroflexion. The hemorrhoids       were large.      The exam was otherwise without abnormality. Impression:               - The examined portion of the ileum was normal.                           - Mild diverticulosis in the sigmoid colon.                           -  Large and friable internal hemorrhoids.                           - The examination was otherwise normal.                           - No specimens collected. Moderate Sedation:      N/A Recommendation:           - Return patient to hospital ward for possible                            discharge same day.                           - Resume regular diet.                           - Continue present medications. Iron has been                            replaced IV.                           - Repeat colonoscopy in 10 years for screening                            purposes.                           - Hemorrhoidal banding (in office) versus surgical                             resection strongly recommended given now years of                            bleeding (seen for the same in 2020) leading to IDA. Procedure Code(s):        --- Professional ---                           [phone], Colonoscopy, flexible; diagnostic, including                            collection of specimen(s) by brushing or washing,                            when performed (separate procedure) Diagnosis Code(s):        --- Professional ---                           K64.8, Other hemorrhoids                           K62.5, Hemorrhage of anus and rectum                           D50.9, Iron deficiency anemia, unspecified  K57.30, Diverticulosis of large intestine without                            perforation or abscess without bleeding CPT copyright 2022 American [employer]. All rights reserved. The codes documented in this report are preliminary and upon coder review may  be revised to meet current compliance requirements. Beverley Fiedler, MD 05/12/2022 11:07:15 AM This report has been signed electronically. Number of Addenda: 0

## 2022-05-12 NOTE — Progress Notes (Signed)
PROGRESS NOTE    John Mccarty  ZOX:[id number] DOB: [birth year] DOA: 05/10/2022 PCP: Pcp, No    Chief Complaint  Patient presents with   GI Bleeding    Brief Narrative:  Patient is a 52 year old gentleman presented to the ED with complaints of shortness of breath, noted to have rectal bleeding intermittently for the past 8 years which he attributed to external hemorrhoids, but over the past 4 days has been having increasing hematochezia, endorses lightheadedness and dizziness.  FOBT done in the ED was negative.  Hemoglobin noted on presentation at 9.5 with repeat at 8.9.  Anemia panel done consistent with iron deficiency anemia.  Patient seen in consultation by gastroenterology and patient for colonoscopy tomorrow 05/12/2022.   Assessment & Plan:   Principal Problem:   GI bleed Active Problems:   Iron deficiency anemia   Rectal bleeding   Acute blood loss anemia   Internal hemorrhoid, bleeding   #1 GI bleed/acute blood loss anemia/iron deficiency anemia -Patient with rectal bleeding, noted to be symptomatic with lightheadedness dizziness and some shortness of breath. -Patient states noted some maroon-colored bloody bowel movements with bowel prep.   -Clinical improvement.   -Hemoglobin at 9.0 this morning. -Anemia panel with iron of 21, ferritin of 3, folate of 15.2, TIBC of 490. 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   -GI recommended IV iron which will be given today and tomorrow.   -Patient diet advanced to a regular diet.   -Serial CBCs, if hemoglobin remains stable in the next 24 hours with clinical improvement and no further bleeding and cleared by GI could potentially discharge home with outpatient follow-up with GI for hemorrhoidal banding versus surgical  resection as per GI patient has now had years of bleeding leading to iron deficiency anemia.  -Transfusion threshold hemoglobin < 7.    DVT prophylaxis: SCDs Code Status: Full Family Communication: Updated patient.  No family at bedside. Disposition: Home when clinically improved, hemoglobin stabilized with no further bleeding, when cleared by GI hopefully in the next 24 hours..  Status is: Observation The patient remains OBS appropriate and will d/c before 2 midnights.   Consultants:  Gastroenterology: Dr. Rhea Belton 05/11/2022   Procedures:  Chest x-ray 05/10/2022 Colonoscopy 05/12/2022 per Dr. Rhea Belton  Antimicrobials:  Anti-infectives (From admission, onward)    None          Subjective: Patient seen in PACU just finished colonoscopy.  States that maroon-colored stool with bowel prep yesterday.  No chest pain.  No shortness of breath.  No abdominal pain.  Dry nasal passages improved with Flonase and Claritin.   Objective: Vitals:   05/12/22 0948 05/12/22 1100 05/12/22 1115 05/12/22 1130  BP: 114/70 91/60 109/67 119/82  Pulse: 67 85 87 65  Resp: 18 18 19 19   Temp: 97.7 F (36.5 C) 97.7 F (36.5 C)  97.7 F (36.5 C)  TempSrc: Temporal     SpO2: 100% 99% 98% 99%  Weight:      Height:        Intake/Output Summary (Last 24 hours) at 05/12/2022 1154 Last data filed at 05/12/2022 1105 Gross per 24 hour  Intake 837 ml  Output --  Net 837 ml   Filed Weights   05/10/22 1823  Weight: 87.1 kg    Examination:  General exam: NAD. Respiratory system: Lungs clear to auscultation  bilaterally.  No wheezes, no crackles, no rhonchi.  Fair air movement.  Speaking in full sentences.   Cardiovascular system: Regular rate rhythm no murmurs rubs or gallops.  No JVD.  No lower extremity edema.  Gastrointestinal system: Abdomen is soft, nontender, nondistended, positive bowel sounds.  No rebound.  No guarding.  Central nervous system: Alert and oriented.  Moving extremities spontaneously.   No focal neurological deficits.   Extremities: Symmetric 5 x 5 power. Skin: No rashes, lesions or ulcers Psychiatry: Judgement and insight appear normal. Mood & affect appropriate.     Data Reviewed: I have personally reviewed following labs and imaging studies  CBC: Recent Labs  Lab 05/10/22 1834 05/10/22 2346 05/11/22 0603 05/11/22 1027 05/11/22 1623 05/12/22 0757  WBC 6.4  --   --   --   --  6.4  HGB 9.5* 8.9* 8.9* 9.1* 9.6* 9.0*  HCT 34.2* 31.6* 29.0* 31.3* 32.5* 30.6*  MCV 72.2*  --   --   --   --  67.8*  PLT 343  --   --   --   --  322     Basic Metabolic Panel: Recent Labs  Lab 05/10/22 1834 05/11/22 0603 05/12/22 0757  NA 140 139 138  K 3.5 3.5 3.7  CL 108 105 109  CO2 21* 22 22  GLUCOSE 90 100* 96  BUN 12 14 13   CREATININE 1.23 1.19 1.26*  CALCIUM 9.2 9.1 9.1  MG  --   --  1.9     GFR: Estimated Creatinine Clearance: 74.7 mL/min (A) (by C-G formula based on SCr of 1.26 mg/dL (H)).  Liver Function Tests: Recent Labs  Lab 05/10/22 1834  AST 21  ALT 18  ALKPHOS 34*  BILITOT 0.5  PROT 7.9  ALBUMIN 4.4     CBG: No results for input(s): "GLUCAP" in the last 168 hours.   No results found for this or any previous visit (from the past 240 hour(s)).       Radiology Studies: DG Chest 2 View  Result Date: 05/10/2022 CLINICAL DATA:  Cough and shortness of breath EXAM: CHEST - 2 VIEW COMPARISON:  Chest x-ray 03/17/2019 FINDINGS: The heart size and mediastinal contours are within normal limits. Both lungs are clear. The visualized skeletal structures are unremarkable. IMPRESSION: No active cardiopulmonary disease. Electronically Signed   By: Darliss Cheney M.D.   On: 05/10/2022 20:23        Scheduled Meds:  [MAR Hold] fluticasone  2 spray Each Nare Daily   [MAR Hold] loratadine  10 mg Oral Daily   Continuous Infusions:  sodium chloride     [MAR Hold] ferric gluconate (FERRLECIT) IVPB     lactated ringers 20 mL/hr at 05/12/22 0954      LOS: 0 days    Time spent: 35 minutes    Ramiro Harvest, MD [facility]   To contact the attending provider between 7A-7P or the covering provider during after hours 7P-7A, please log into the web site [email or web address] and access using universal [place] password for that web site. If you do not have the password, please call the hospital operator.  05/12/2022, 11:54 AM

## 2022-05-12 NOTE — Anesthesia Preprocedure Evaluation (Addendum)
Anesthesia Evaluation  Patient identified by MRN, date of birth, ID band Patient awake    Reviewed: Allergy & Precautions, H&P , NPO status , Patient's Chart, lab work & pertinent test results  Airway Mallampati: II  TM Distance: >3 FB Neck ROM: Full    Dental no notable dental hx. (+) Teeth Intact, Dental Advisory Given   Pulmonary neg pulmonary ROS   Pulmonary exam normal breath sounds clear to auscultation       Cardiovascular negative cardio ROS  Rhythm:Regular Rate:Normal     Neuro/Psych negative neurological ROS  negative psych ROS   GI/Hepatic negative GI ROS, Neg liver ROS,,,  Endo/Other  negative endocrine ROS    Renal/GU negative Renal ROS  negative genitourinary   Musculoskeletal   Abdominal   Peds  Hematology  (+) Blood dyscrasia, anemia   Anesthesia Other Findings   Reproductive/Obstetrics negative OB ROS                             Anesthesia Physical Anesthesia Plan  ASA: 2  Anesthesia Plan: MAC   Post-op Pain Management: Minimal or no pain anticipated   Induction: Intravenous  PONV Risk Score and Plan: 1 and Propofol infusion  Airway Management Planned: Natural Airway and Simple Face Mask  Additional Equipment:   Intra-op Plan:   Post-operative Plan:   Informed Consent: I have reviewed the patients History and Physical, chart, labs and discussed the procedure including the risks, benefits and alternatives for the proposed anesthesia with the patient or authorized representative who has indicated his/her understanding and acceptance.     Dental advisory given  Plan Discussed with: CRNA  Anesthesia Plan Comments:        Anesthesia Quick Evaluation

## 2022-05-12 NOTE — Anesthesia Postprocedure Evaluation (Signed)
Anesthesia Post Note  Patient: John Mccarty  Procedure(s) Performed: COLONOSCOPY WITH PROPOFOL     Patient location during evaluation: Endoscopy Anesthesia Type: MAC Level of consciousness: awake and alert Pain management: pain level controlled Vital Signs Assessment: post-procedure vital signs reviewed and stable Respiratory status: spontaneous breathing, nonlabored ventilation and respiratory function stable Cardiovascular status: stable and blood pressure returned to baseline Postop Assessment: no apparent nausea or vomiting Anesthetic complications: no  No notable events documented.  Last Vitals:  Vitals:   05/12/22 1115 05/12/22 1130  BP: 109/67 119/82  Pulse: 87 65  Resp: 19 19  Temp:  36.5 C  SpO2: 98% 99%    Last Pain:  Vitals:   05/12/22 1130  TempSrc:   PainSc: 0-No pain                 Jane Broughton,W. EDMOND

## 2022-05-12 NOTE — Procedures (Addendum)
I spoke to the patient after procedure.  He prefers more definitive treatment for hemorrhoids which I think is reasonable given the significant bleeding that he has had over the years and iron deficiency anemia  I will refer him to [facility] as an outpatient

## 2022-05-12 NOTE — Transfer of Care (Signed)
Immediate Anesthesia Transfer of Care Note  Patient: John Mccarty  Procedure(s) Performed: COLONOSCOPY WITH PROPOFOL  Patient Location: PACU  Anesthesia Type:MAC  Level of Consciousness: awake and drowsy  Airway & Oxygen Therapy: Patient Spontanous Breathing  Post-op Assessment: Report given to RN and Post -op Vital signs reviewed and stable  Post vital signs: Reviewed and stable  Last Vitals:  Vitals Value Taken Time  BP 91/60 05/12/22 1102  Temp 36.5 C 05/12/22 1100  Pulse 77 05/12/22 1104  Resp 19 05/12/22 1104  SpO2 100 % 05/12/22 1104  Vitals shown include unvalidated device data.  Last Pain:  Vitals:   05/12/22 0948  TempSrc: Temporal  PainSc: 0-No pain         Complications: No notable events documented.

## 2022-05-12 NOTE — Anesthesia Procedure Notes (Signed)
Procedure Name: MAC Date/Time: 05/12/2022 10:29 AM  Performed by: Lelon Perla, CRNAPre-anesthesia Checklist: Patient identified, Emergency Drugs available, Suction available, Patient being monitored and Timeout performed Patient Re-evaluated:Patient Re-evaluated prior to induction Oxygen Delivery Method: Simple face mask Preoxygenation: Pre-oxygenation with 100% oxygen Induction Type: IV induction Placement Confirmation: positive ETCO2 Dental Injury: Teeth and Oropharynx as per pre-operative assessment

## 2022-05-12 NOTE — Plan of Care (Signed)

## 2022-05-13 ENCOUNTER — Telehealth: Payer: Self-pay

## 2022-05-13 DIAGNOSIS — T7840XA Allergy, unspecified, initial encounter: Secondary | ICD-10-CM

## 2022-05-13 DIAGNOSIS — K648 Other hemorrhoids: Secondary | ICD-10-CM

## 2022-05-13 DIAGNOSIS — T7840XD Allergy, unspecified, subsequent encounter: Secondary | ICD-10-CM

## 2022-05-13 DIAGNOSIS — D500 Iron deficiency anemia secondary to blood loss (chronic): Secondary | ICD-10-CM

## 2022-05-13 LAB — BASIC METABOLIC PANEL
Anion gap: 8 (ref 5–15)
BUN: 11 mg/dL (ref 6–20)
CO2: 24 mmol/L (ref 22–32)
Calcium: 9 mg/dL (ref 8.9–10.3)
Chloride: 106 mmol/L (ref 98–111)
Creatinine, Ser: 1.4 mg/dL — ABNORMAL HIGH (ref 0.61–1.24)
GFR, Estimated: >60 mL/min (ref >60)
Glucose, Bld: 97 mg/dL (ref 70–99)
Potassium: 3.6 mmol/L (ref 3.5–5.1)
Sodium: 138 mmol/L (ref 135–145)

## 2022-05-13 LAB — HEMOGLOBIN AND HEMATOCRIT, BLOOD
HCT: 30 % — ABNORMAL LOW (ref 39.0–52.0)
Hemoglobin: 8.8 g/dL — ABNORMAL LOW (ref 13.0–17.0)

## 2022-05-13 MED ORDER — LORATADINE 10 MG PO TABS: 10.0000 mg | ORAL_TABLET | Freq: Every day | ORAL | 0 refills | Status: DC

## 2022-05-13 MED ORDER — GABAPENTIN 100 MG PO CAPS: 100.0000 mg | ORAL_CAPSULE | Freq: Once | ORAL | Status: DC

## 2022-05-13 MED ORDER — HYDROCORTISONE SOD SUC (PF) 100 MG IJ SOLR
100.0000 mg | Freq: Once | INTRAMUSCULAR | Status: AC
  Administered 2022-05-13: 100 mg via INTRAVENOUS
  Filled 2022-05-13 (×2): qty 2

## 2022-05-13 MED ORDER — DIPHENHYDRAMINE HCL 25 MG PO CAPS: 25.0000 mg | ORAL_CAPSULE | Freq: Once | ORAL | Status: DC

## 2022-05-13 MED ORDER — GABAPENTIN 300 MG PO CAPS: 300.0000 mg | ORAL_CAPSULE | Freq: Once | ORAL | Status: DC

## 2022-05-13 MED ORDER — FLUTICASONE PROPIONATE 50 MCG/ACT NA SUSP: 2.0000 | Freq: Every day | NASAL | 0 refills | Status: DC

## 2022-05-13 NOTE — Telephone Encounter (Signed)
Referral faxed to [facility] 

## 2022-05-13 NOTE — Plan of Care (Signed)

## 2022-05-13 NOTE — Telephone Encounter (Signed)
-----   Message from Beverley Fiedler, MD sent at 05/12/2022 11:18 AM EDT ----- Please refer this patient to Dr. Michaell Cowing for hemorrhoidectomy Persistently bleeding large internal hemorrhoids leading to iron deficiency anemia Thanks JMP

## 2022-05-13 NOTE — Discharge Summary (Signed)
Physician Discharge Summary  John Mccarty ZOX:[id number] DOB: February 04, 1970 DOA: 05/10/2022  John Mccarty: John Mccarty, No  Admit date: 05/10/2022 Discharge date: 05/13/2022  Time spent: 60 minutes  Recommendations for Outpatient Follow-up:  Follow-up with John Mccarty in 1 to 2 weeks.  On follow-up patient need a CBC done to follow-up on hemoglobin. Follow-up with Dr. Erick Blinks, gastroenterology in 2 to 3 weeks.   Discharge Diagnoses:  Principal Problem:   GI bleed Active Problems:   Iron deficiency anemia   Rectal bleeding   Acute blood loss anemia   Internal hemorrhoid, bleeding   Hypersensitivity   Discharge Condition: Stable and improved.  Diet recommendation: Regular  Filed Weights   05/10/22 1823  Weight: 87.1 kg    History of present illness:  HPI per Dr. Joneen Roach This is a 52 year old male who presents with complaints of shortness of breath.  Per patient he has had rectal bleeding for this last 8 years.  Per patient he has external hemorrhoids.  Recently has been having blood with each BM.  Patient represents significantly increased frequency.  He endorses lightheadedness, dizziness, and shortness of breath.  Patient denies abdominal pain, fever but endorses chills.   In the ER patient occult stool negative.  Patient's initial hemoglobin was 9.5.  Repeat 8.6.  Patient has no prior labs for comparison.  Admission for GI consult requested for symptomatic anemia.  Hospital Course:  #1 GI bleed/acute blood loss anemia/iron deficiency anemia -Patient with rectal bleeding, noted to be symptomatic with lightheadedness dizziness and some shortness of breath. -Patient admitted to the hospital, GI consulted for further evaluation and management and patient initially placed on clear liquids. -Anemia panel with iron of 21, ferritin of 3, folate of 15.2, TIBC of 490. -Patient seen in consultation by gastroenterology, patient underwent bowel prep and underwent colonoscopy, 05/12/2022 which showed multiple  small mouth diverticula found in the sigmoid colon, internal hemorrhoids noted during retroflexion which were large.   -GI recommended IV iron which patient received during the hospitalization.   -Patient's diet subsequently advanced post colonoscopy.   -Patient underwent serial CBCs and hemoglobin stabilized at 8.8 by day of discharge.   -Patient had no significant further bleeding.   -Outpatient  follow-up with GI for hemorrhoidal banding versus surgical resection as per GI patient has now had years of bleeding leading to iron deficiency anemia.  -Patient was discharged in stable and improved condition.  2.  Hypersensitivity reaction to IV iron infusion -Patient noted to have tolerated initial IV iron infusion 05/12/2022. -Patient received second IV iron infusion 05/13/2022 and after was subsequently completed the morning of 05/13/2022 patient started to have some tingling in his hands and feet which progressed, some associated nausea vomiting as well as diaphoresis. -IV iron infusion has been completed. -Patient given a dose of IV hydrocortisone x 1. -Neurontin was ordered however patient symptoms improved rapidly and subsequently not given. -Patient improved clinically, had no further nausea or vomiting, was monitored in-house for about 8 hours and continued to improve clinically. -Patient was discharged in stable and improved condition.  Procedures: Chest x-ray 05/10/2022 Colonoscopy 05/12/2022 per Dr. Rhea Belton  Consultations: Gastroenterology: Dr. Rhea Belton 05/11/2022    Discharge Exam: Vitals:   05/13/22 0809 05/13/22 1518  BP: 130/84 117/83  Pulse: (!) 110 95  Resp: 18 18  Temp: 98.2 F (36.8 C) 98.2 F (36.8 C)  SpO2: 100% 100%    General: NAD Cardiovascular: Regular rate rhythm no murmurs rubs or gallops.  No JVD.  No lower extremity edema.  Respiratory: Clear to auscultation bilaterally.  No wheezes, no crackles, no rhonchi.  Fair air movement.  Speaking in full  sentences.  Discharge Instructions   Discharge Instructions     Diet general   Complete by: As directed    Increase activity slowly   Complete by: As directed       Allergies as of 05/13/2022   No Known Allergies      Medication List     TAKE these medications    fluticasone 50 MCG/ACT nasal spray Commonly known as: FLONASE Place 2 sprays into both nostrils daily for 5 days. Start taking on: May 14, 2022   loratadine 10 MG tablet Commonly known as: CLARITIN Take 1 tablet (10 mg total) by mouth daily. Start taking on: May 14, 2022   NYQUIL PO Take 30 mLs by mouth daily as needed (For cold symptoms).       No Known Allergies  Follow-up Information     John Mccarty. Schedule an appointment as soon as possible for a visit in 1 week(s).   Why: Follow-up in 1 to 2 weeks        Pyrtle, Carie Caddy, MD. Schedule an appointment as soon as possible for a visit in 3 week(s).   Specialty: Gastroenterology Why: Follow-up in 2 to 3 weeks. Contact information: [address] [place] [address] [phone]                  The results of significant diagnostics from this hospitalization (including imaging, microbiology, ancillary and laboratory) are listed below for reference.    Significant Diagnostic Studies: DG Chest 2 View  Result Date: 05/10/2022 CLINICAL DATA:  Cough and shortness of breath EXAM: CHEST - 2 VIEW COMPARISON:  Chest x-ray 03/17/2019 FINDINGS: The heart size and mediastinal contours are within normal limits. Both lungs are clear. The visualized skeletal structures are unremarkable. IMPRESSION: No active cardiopulmonary disease. Electronically Signed   By: Darliss Cheney M.D.   On: 05/10/2022 20:23    Microbiology: No results found for this or any previous visit (from the past 240 hour(s)).   Labs: Basic Metabolic Panel: Recent Labs  Lab 05/10/22 1834 05/11/22 0603 05/12/22 0757 05/13/22 0646  NA 140 139 138 138  K 3.5 3.5 3.7 3.6  CL 108  105 109 106  CO2 21* 22 22 24   GLUCOSE 90 100* 96 97  BUN 12 14 13 11   CREATININE 1.23 1.19 1.26* 1.40*  CALCIUM 9.2 9.1 9.1 9.0  MG  --   --  1.9  --    Liver Function Tests: Recent Labs  Lab 05/10/22 1834  AST 21  ALT 18  ALKPHOS 34*  BILITOT 0.5  PROT 7.9  ALBUMIN 4.4   No results for input(s): "LIPASE", "AMYLASE" in the last 168 hours. No results for input(s): "AMMONIA" in the last 168 hours. CBC: Recent Labs  Lab 05/10/22 1834 05/10/22 2346 05/11/22 1027 05/11/22 1623 05/12/22 0757 05/12/22 1455 05/13/22 0646  WBC 6.4  --   --   --  6.4  --   --   HGB 9.5*   < > 9.1* 9.6* 9.0* 11.1* 8.8*  HCT 34.2*   < > 31.3* 32.5* 30.6* 38.2* 30.0*  MCV 72.2*  --   --   --  67.8*  --   --   PLT 343  --   --   --  322  --   --    < > = values in this interval  not displayed.   Cardiac Enzymes: No results for input(s): "CKTOTAL", "CKMB", "CKMBINDEX", "TROPONINI" in the last 168 hours. BNP: BNP (last 3 results) No results for input(s): "BNP" in the last 8760 hours.  ProBNP (last 3 results) No results for input(s): "PROBNP" in the last 8760 hours.  CBG: No results for input(s): "GLUCAP" in the last 168 hours.     Signed:  Ramiro Harvest MD.  [facility] 05/13/2022, 4:41 PM

## 2022-05-13 NOTE — TOC Progression Note (Signed)
Transition of Care [facility]) - Progression Note    Patient Details  Name: John Mccarty MRN: [id number] Date of Birth: 06-29-1970  Transition of Care [facility]) CM/SW Contact  Janae Bridgeman, RN Phone Number: 05/13/2022, 4:37 PM  Clinical Narrative:     Transition of Care [facility]) Screening Note   Patient Details  Name: John Mccarty Date of Birth: [birth year]   Transition of Care [facility]) CM/SW Contact:    Janae Bridgeman, RN Phone Number: 05/13/2022, 4:38 PM    Transition of Care Department [facility]) has reviewed patient and no TOC needs have been identified at this time. We will continue to monitor patient advancement through interdisciplinary progression rounds. If new patient transition needs arise, please place a TOC consult.          Expected Discharge Plan and Services         Expected Discharge Date: 05/13/22                                     Social Determinants of Health (SDOH) Interventions SDOH Screenings   Tobacco Use: Low Risk  (05/12/2022)    Readmission Risk Interventions     No data to display

## 2022-05-15 ENCOUNTER — Encounter (HOSPITAL_COMMUNITY): Payer: Self-pay | Admitting: Internal Medicine

## 2022-05-16 ENCOUNTER — Encounter: Payer: Self-pay | Admitting: Gastroenterology

## 2022-06-02 DIAGNOSIS — K922 Gastrointestinal hemorrhage, unspecified: Secondary | ICD-10-CM | POA: Diagnosis not present

## 2022-06-02 DIAGNOSIS — R0789 Other chest pain: Secondary | ICD-10-CM | POA: Diagnosis not present

## 2022-07-20 ENCOUNTER — Telehealth: Payer: Self-pay

## 2022-07-20 ENCOUNTER — Ambulatory Visit: Payer: 59 | Admitting: Gastroenterology

## 2022-07-20 ENCOUNTER — Encounter: Payer: Self-pay | Admitting: Gastroenterology

## 2022-07-20 VITALS — BP 120/80 | HR 70 | Ht 67.0 in | Wt 187.4 lb

## 2022-07-20 DIAGNOSIS — K625 Hemorrhage of anus and rectum: Secondary | ICD-10-CM

## 2022-07-20 DIAGNOSIS — K648 Other hemorrhoids: Secondary | ICD-10-CM

## 2022-07-20 NOTE — Progress Notes (Signed)
Chief Complaint:Hemorrhoids Primary GI MD: Dr. Marina Goodell  HPI: 52 year old male with history of hemorrhoids, presents for hospital follow-up.  Recently admitted 5/7 to 05/13/2022 for symptomatic anemia and rectal bleeding.  Patient presented to emergency department with shortness of breath and found to have hemoglobin of 9.5.  FOBT negative stools.  Had been seen in the past for rectal bleeding and hemorrhoids and even referred to [facility] but never followed through.  During admission patient underwent colonoscopy which showed normal ileum, mild diverticulosis in sigmoid colon.  Large and friable internal hemorrhoids.  Otherwise normal no specimens collected.  Patient was referred to [facility] for more definitive treatment since he has had significant bleeding over the years resulting in iron deficiency anemia.  Last hgb 05/2022 was 11.1  Patient states his rectal bleeding is still present, though it has improved some. States he has intermittent rectal bleeding every day or every other day both in the toilet and on the tissue paper. States sometimes he feels his hemorrhoids come out and he has to manually push them back in. Has tried suppositories without relief. Denies weight loss, abdominal pain, nausea, or vomiting   No past medical history on file.  Past Surgical History:  Procedure Laterality Date   ABDOMINAL SURGERY     COLONOSCOPY WITH PROPOFOL N/A 05/12/2022   Procedure: COLONOSCOPY WITH PROPOFOL;  Surgeon: Beverley Fiedler, MD;  Location: [facility] ENDOSCOPY;  Service: Gastroenterology;  Laterality: N/A;   STOMACH SURGERY     as child - age 79    No current outpatient medications on file.   No current facility-administered medications for this visit.    Allergies as of 07/20/2022   (No Known Allergies)    Family History  Problem Relation Age of Onset   Liver disease Father    Cirrhosis Father    Diabetes Father     Social History   Socioeconomic History   Marital status: Single     Spouse name: Not on file   Number of children: Not on file   Years of education: Not on file   Highest education level: Not on file  Occupational History   Occupation: landscaping   Tobacco Use   Smoking status: Never   Smokeless tobacco: Never  Substance and Sexual Activity   Alcohol use: Yes    Comment: occ   Drug use: Yes    Types: Marijuana   Sexual activity: Yes  Other Topics Concern   Not on file  Social History Narrative   ** Merged History Encounter **       Social Determinants of Health   Financial Resource Strain: Not on file  Food Insecurity: Not on file  Transportation Needs: Not on file  Physical Activity: Not on file  Stress: Not on file  Social Connections: Not on file  Intimate Partner Violence: Not on file    Review of Systems:    Constitutional: No weight loss, fever, chills, weakness or fatigue HEENT: Eyes: No change in vision               Ears, Nose, Throat:  No change in hearing or congestion Skin: No rash or itching Cardiovascular: No chest pain, chest pressure or palpitations   Respiratory: No SOB or cough Gastrointestinal: See HPI and otherwise negative Genitourinary: No dysuria or change in urinary frequency Neurological: No headache, dizziness or syncope Musculoskeletal: No new muscle or joint pain Hematologic: No bleeding or bruising Psychiatric: No history of depression or anxiety    Physical  Exam:  Vital signs: BP 120/80   Pulse 70   Ht 5\' 7"  (1.702 m)   Wt 85 kg   BMI 29.35 kg/m   Constitutional: NAD, Well developed, Well nourished, alert and cooperative Head:  Normocephalic and atraumatic. Eyes:   PEERL, EOMI. No icterus. Conjunctiva pink. Respiratory: Respirations even and unlabored. Lungs clear to auscultation bilaterally.   No wheezes, crackles, or rhonchi.  Cardiovascular:  Regular rate and rhythm. No peripheral edema, cyanosis or pallor.  Gastrointestinal:  Soft, nondistended, nontender. No rebound or guarding. Normal  bowel sounds. No appreciable masses or hepatomegaly. Rectal:  Not performed.  Msk:  Symmetrical without gross deformities. Without edema, no deformity or joint abnormality.  Neurologic:  Alert and  oriented x4;  grossly normal neurologically.  Skin:   Dry and intact without significant lesions or rashes. Psychiatric: Oriented to person, place and time. Demonstrates good judgement and reason without abnormal affect or behaviors.   RELEVANT LABS AND IMAGING: CBC    Component Value Date/Time   WBC 6.4 05/12/2022 0757   RBC 4.51 05/12/2022 0757   HGB 8.8 (L) 05/13/2022 0646   HCT 30.0 (L) 05/13/2022 0646   PLT 322 05/12/2022 0757   MCV 67.8 (L) 05/12/2022 0757   MCH 20.0 (L) 05/12/2022 0757   MCHC 29.4 (L) 05/12/2022 0757   RDW 17.3 (H) 05/12/2022 0757    CMP     Component Value Date/Time   NA 138 05/13/2022 0646   K 3.6 05/13/2022 0646   CL 106 05/13/2022 0646   CO2 24 05/13/2022 0646   GLUCOSE 97 05/13/2022 0646   BUN 11 05/13/2022 0646   CREATININE 1.40 (H) 05/13/2022 0646   CALCIUM 9.0 05/13/2022 0646   PROT 7.9 05/10/2022 1834   ALBUMIN 4.4 05/10/2022 1834   AST 21 05/10/2022 1834   ALT 18 05/10/2022 1834   ALKPHOS 34 (L) 05/10/2022 1834   BILITOT 0.5 05/10/2022 1834   GFRNONAA >60 05/13/2022 0646   GFRAA >60 03/17/2019 2222     Assessment/Plan:   Internal hemorrhoid, bleeding Recent admission for symptomatic anemia secondary to rectal bleeding with colonoscopy showing large friable internal hemorrhoids. Patient has not seen [facility] yet - refer to [facility] for evaluation of hemorrhoidectomy - continue to increase fiber, increase water, and increase exercise - could benefit from a squatty potty.  Boone Master, PA-C [facility] 07/20/2022, 12:45 PM  Cc: No ref. provider found

## 2022-07-20 NOTE — Patient Instructions (Signed)
_______________________________________________________  If your blood pressure at your visit was 140/90 or greater, please contact your primary care physician to follow up on this.  _______________________________________________________  If you are age 50 or older, your body mass index should be between 23-30. Your Body mass index is 29.35 kg/m. If this is out of the aforementioned range listed, please consider follow up with your Primary Care Provider.  If you are age 38 or younger, your body mass index should be between 19-25. Your Body mass index is 29.35 kg/m. If this is out of the aformentioned range listed, please consider follow up with your Primary Care Provider.   ________________________________________________________  The  GI providers would like to encourage you to use [facility] to communicate with providers for non-urgent requests or questions.  Due to long hold times on the telephone, sending your provider a message by [facility] may be a faster and more efficient way to get a response.  Please allow 48 business hours for a response.  Please remember that this is for non-urgent requests.  _______________________________________________________  We will send your records to [facility]. Make certain to bring a list of current medications, including any over the counter medications or vitamins. Also bring your co-pay if you have one as well as your insurance cards. [facility] is located at [address]. Should you need to reschedule your appointment, please contact them at [phone].

## 2022-07-20 NOTE — Telephone Encounter (Signed)
Will await appointment info.

## 2022-08-04 NOTE — Telephone Encounter (Signed)
Appointment with A.Maisie Fus, MD on 08-15-2022

## 2022-08-15 ENCOUNTER — Ambulatory Visit: Payer: Self-pay | Admitting: General Surgery

## 2022-08-15 DIAGNOSIS — K642 Third degree hemorrhoids: Secondary | ICD-10-CM | POA: Diagnosis not present

## 2022-08-15 NOTE — H&P (Signed)
   REFERRING PHYSICIAN:  Boone Master, PA  PROVIDER:  Elenora Gamma, MD  MRN: [id number] DOB: 1970-12-16 DATE OF ENCOUNTER: 08/15/2022  Subjective   Chief Complaint: New Consultation     History of Present Illness: John Mccarty is a 52 y.o. male who is seen today as an office consultation at the request of Dr. Darreld Mclean for evaluation of New Consultation .  52 year old male who presents to the office for evaluation of bleeding hemorrhoids.  He states that he has had rectal bleeding for the past 8 years.  He was hospitalized in May due to rectal bleeding and anemia.  Colonoscopy was performed at that time and showed friable hemorrhoids but no other source for his bleeding.  He is here today to discuss this further.   Review of Systems: A complete review of systems was obtained from the patient.  I have reviewed this information and discussed as appropriate with the patient.  See HPI as well for other ROS.   Medical History: Past Medical History:  Diagnosis Date   Anemia     There is no problem list on file for this patient.   Past Surgical History:  Procedure Laterality Date   VASCULAR SURGERY       No Known Allergies  Current Outpatient Medications on File Prior to Visit  Medication Sig Dispense Refill   cholecalciferol 1000 unit tablet Take by mouth     ferrous sulfate 325 (65 FE) MG EC tablet Take 325 mg by mouth daily with breakfast     No current facility-administered medications on file prior to visit.    Family History  Problem Relation Age of Onset   High blood pressure (Hypertension) Mother    Diabetes Father      Social History   Tobacco Use  Smoking Status Never  Smokeless Tobacco Never     Social History   Socioeconomic History   Marital status: Single  Tobacco Use   Smoking status: Never   Smokeless tobacco: Never  Substance and Sexual Activity   Alcohol use: Never   Drug use: Yes    Objective:    Vitals:   08/15/22  1038 08/15/22 1039  BP: 116/80   Pulse: 87   Temp: 36.7 C (98.1 F)   SpO2: 99%   Weight: 84.9 kg (187 lb 3.2 oz)   Height: 170.2 cm (5\' 7" )   PainSc:  0-No pain  PainLoc:  Rectum     Exam Gen: NAD Abd: soft Rectal: normal tone, no external tags   Labs, Imaging and Diagnostic Testing:  Procedure: Anoscopy Surgeon: Maisie Fus After the risks and benefits were explained, written consent was obtained for above procedure.  A medical assistant chaperone was present thoroughout the entire procedure.  Anesthesia: none Diagnosis: rectal bleeding Findings: Grade 3 RP and LL hemorrhoids  Assessment and Plan:  Diagnoses and all orders for this visit:  Prolapsed internal hemorrhoids, grade 41  52 year old male with grade 3 hemorrhoids and rectal bleeding causing anemia.  We discussed surgical correction of the disease.  Given that he has internal hemorrhoids only, I think he would be a good candidate for transsphenoidal dearterialization.  We discussed this in detail.  It has a 97% success rate with rectal bleeding.  We discussed typical postoperative pain and apical postoperative risks including bleeding and urinary retention.  All questions were answered.  Patient would like to proceed with surgery.    Vanita Panda, MD Colon and Rectal Surgery [facility]

## 2022-09-08 ENCOUNTER — Encounter (HOSPITAL_BASED_OUTPATIENT_CLINIC_OR_DEPARTMENT_OTHER): Payer: Self-pay | Admitting: General Surgery

## 2022-09-08 NOTE — Progress Notes (Signed)
Spoke w/ via phone for pre-op interview--- pt Lab needs dos----   no            Lab results------ no COVID test -----patient states asymptomatic no test needed Arrive at ------- 0700 on 09-16-2022 NPO after MN NO Solid Food.  Clear liquids from MN until--- 0600 Med rec completed Medications to take morning of surgery ----- none Diabetic medication ----- n/a Patient instructed no nail polish to be worn day of surgery Patient instructed to bring photo id and insurance card day of surgery Patient aware to have Driver (ride ) / caregiver    for 24 hours after surgery --- sister, darlene Patient Special Instructions ----- n/a Pre-Op special Instructions ----- n/a Patient verbalized understanding of instructions that were given at this phone interview. Patient denies shortness of breath, chest pain, fever, cough at this phone interview.

## 2022-09-16 ENCOUNTER — Ambulatory Visit (HOSPITAL_BASED_OUTPATIENT_CLINIC_OR_DEPARTMENT_OTHER): Payer: 59 | Admitting: Anesthesiology

## 2022-09-16 ENCOUNTER — Other Ambulatory Visit: Payer: Self-pay

## 2022-09-16 ENCOUNTER — Ambulatory Visit (HOSPITAL_BASED_OUTPATIENT_CLINIC_OR_DEPARTMENT_OTHER)
Admission: RE | Admit: 2022-09-16 | Discharge: 2022-09-16 | Disposition: A | Discharge status: Home / Self Care | Payer: 59 | Attending: General Surgery | Admitting: General Surgery

## 2022-09-16 ENCOUNTER — Encounter (HOSPITAL_BASED_OUTPATIENT_CLINIC_OR_DEPARTMENT_OTHER): Payer: Self-pay | Admitting: General Surgery

## 2022-09-16 ENCOUNTER — Encounter (HOSPITAL_BASED_OUTPATIENT_CLINIC_OR_DEPARTMENT_OTHER)
Admission: RE | Disposition: A | Discharge status: Home / Self Care | Payer: Self-pay | Source: Home / Self Care | Attending: General Surgery

## 2022-09-16 DIAGNOSIS — K625 Hemorrhage of anus and rectum: Secondary | ICD-10-CM | POA: Insufficient documentation

## 2022-09-16 DIAGNOSIS — D649 Anemia, unspecified: Secondary | ICD-10-CM | POA: Insufficient documentation

## 2022-09-16 DIAGNOSIS — K642 Third degree hemorrhoids: Secondary | ICD-10-CM | POA: Diagnosis not present

## 2022-09-16 DIAGNOSIS — Z01818 Encounter for other preprocedural examination: Secondary | ICD-10-CM

## 2022-09-16 HISTORY — DX: Iron deficiency anemia secondary to blood loss (chronic): D50.0

## 2022-09-16 HISTORY — DX: Personal history of other diseases of the digestive system: Z87.19

## 2022-09-16 HISTORY — DX: Hemorrhage of anus and rectum: K62.5

## 2022-09-16 HISTORY — PX: TRANSANAL HEMORRHOIDAL DEARTERIALIZATION: SHX6136

## 2022-09-16 SURGERY — TRANSANAL HEMORRHOIDAL DEARTERIALIZATION
Anesthesia: Monitor Anesthesia Care | Site: Rectum

## 2022-09-16 MED ORDER — ACETAMINOPHEN 500 MG PO TABS
ORAL_TABLET | ORAL | Status: AC
  Filled 2022-09-16: qty 2

## 2022-09-16 MED ORDER — BUPIVACAINE-EPINEPHRINE 0.5% -1:200000 IJ SOLN
INTRAMUSCULAR | Status: DC | PRN
  Administered 2022-09-16: 30 mL

## 2022-09-16 MED ORDER — DIAZEPAM 5 MG PO TABS: 5.0000 mg | ORAL_TABLET | Freq: Three times a day (TID) | ORAL | 0 refills | Status: AC | PRN

## 2022-09-16 MED ORDER — ONDANSETRON HCL 4 MG/2ML IJ SOLN
INTRAMUSCULAR | Status: DC | PRN
  Administered 2022-09-16: 4 mg via INTRAVENOUS

## 2022-09-16 MED ORDER — GABAPENTIN 300 MG PO CAPS
300.0000 mg | ORAL_CAPSULE | ORAL | Status: AC
  Administered 2022-09-16: 300 mg via ORAL

## 2022-09-16 MED ORDER — FENTANYL CITRATE (PF) 100 MCG/2ML IJ SOLN
INTRAMUSCULAR | Status: AC
  Filled 2022-09-16: qty 2

## 2022-09-16 MED ORDER — FENTANYL CITRATE (PF) 100 MCG/2ML IJ SOLN: 25.0000 ug | INTRAMUSCULAR | Status: DC | PRN

## 2022-09-16 MED ORDER — GABAPENTIN 300 MG PO CAPS
ORAL_CAPSULE | ORAL | Status: AC
  Filled 2022-09-16: qty 1

## 2022-09-16 MED ORDER — MIDAZOLAM HCL 5 MG/5ML IJ SOLN
INTRAMUSCULAR | Status: DC | PRN
  Administered 2022-09-16: 2 mg via INTRAVENOUS

## 2022-09-16 MED ORDER — LIDOCAINE HCL (PF) 2 % IJ SOLN
INTRAMUSCULAR | Status: AC
  Filled 2022-09-16: qty 5

## 2022-09-16 MED ORDER — DEXAMETHASONE SODIUM PHOSPHATE 4 MG/ML IJ SOLN
INTRAMUSCULAR | Status: DC | PRN
  Administered 2022-09-16: 5 mg via INTRAVENOUS

## 2022-09-16 MED ORDER — ONDANSETRON HCL 4 MG/2ML IJ SOLN
INTRAMUSCULAR | Status: AC
  Filled 2022-09-16: qty 2

## 2022-09-16 MED ORDER — PROPOFOL 1000 MG/100ML IV EMUL
INTRAVENOUS | Status: AC
  Filled 2022-09-16: qty 100

## 2022-09-16 MED ORDER — MIDAZOLAM HCL 2 MG/2ML IJ SOLN
INTRAMUSCULAR | Status: AC
  Filled 2022-09-16: qty 2

## 2022-09-16 MED ORDER — PROPOFOL 10 MG/ML IV BOLUS
INTRAVENOUS | Status: DC | PRN
  Administered 2022-09-16: 30 mg via INTRAVENOUS

## 2022-09-16 MED ORDER — CELECOXIB 200 MG PO CAPS
200.0000 mg | ORAL_CAPSULE | ORAL | Status: AC
  Administered 2022-09-16: 200 mg via ORAL

## 2022-09-16 MED ORDER — FENTANYL CITRATE (PF) 100 MCG/2ML IJ SOLN
INTRAMUSCULAR | Status: DC | PRN
  Administered 2022-09-16: 25 ug via INTRAVENOUS
  Administered 2022-09-16: 50 ug via INTRAVENOUS
  Administered 2022-09-16: 25 ug via INTRAVENOUS

## 2022-09-16 MED ORDER — BUPIVACAINE LIPOSOME 1.3 % IJ SUSP: 20.0000 mL | Freq: Once | INTRAMUSCULAR | Status: DC

## 2022-09-16 MED ORDER — BUPIVACAINE LIPOSOME 1.3 % IJ SUSP
INTRAMUSCULAR | Status: DC | PRN
  Administered 2022-09-16: 20 mL

## 2022-09-16 MED ORDER — OXYCODONE HCL 5 MG/5ML PO SOLN: 5.0000 mg | Freq: Once | ORAL | Status: DC | PRN

## 2022-09-16 MED ORDER — LACTATED RINGERS IV SOLN: INTRAVENOUS | Status: DC

## 2022-09-16 MED ORDER — LIDOCAINE HCL (CARDIAC) PF 100 MG/5ML IV SOSY
PREFILLED_SYRINGE | INTRAVENOUS | Status: DC | PRN
  Administered 2022-09-16: 100 mg via INTRAVENOUS

## 2022-09-16 MED ORDER — OXYCODONE HCL 5 MG PO TABS: 5.0000 mg | ORAL_TABLET | Freq: Once | ORAL | Status: DC | PRN

## 2022-09-16 MED ORDER — DEXAMETHASONE SODIUM PHOSPHATE 10 MG/ML IJ SOLN
INTRAMUSCULAR | Status: AC
  Filled 2022-09-16: qty 1

## 2022-09-16 MED ORDER — OXYCODONE HCL 5 MG PO TABS: 5.0000 mg | ORAL_TABLET | Freq: Four times a day (QID) | ORAL | 0 refills | Status: AC | PRN

## 2022-09-16 MED ORDER — DROPERIDOL 2.5 MG/ML IJ SOLN: 0.6250 mg | Freq: Once | INTRAMUSCULAR | Status: DC | PRN

## 2022-09-16 MED ORDER — PROPOFOL 500 MG/50ML IV EMUL
INTRAVENOUS | Status: DC | PRN
  Administered 2022-09-16: 125 ug/kg/min via INTRAVENOUS

## 2022-09-16 MED ORDER — CELECOXIB 200 MG PO CAPS
ORAL_CAPSULE | ORAL | Status: AC
  Filled 2022-09-16: qty 1

## 2022-09-16 MED ORDER — SODIUM CHLORIDE 0.9% FLUSH: 3.0000 mL | Freq: Two times a day (BID) | INTRAVENOUS | Status: DC

## 2022-09-16 MED ORDER — ACETAMINOPHEN 500 MG PO TABS
1000.0000 mg | ORAL_TABLET | ORAL | Status: AC
  Administered 2022-09-16: 1000 mg via ORAL

## 2022-09-16 SURGICAL SUPPLY — 36 items
BRIEF MESH DISP LRG (UNDERPADS AND DIAPERS) IMPLANT
COVER BACK TABLE 60X90IN (DRAPES) ×1 IMPLANT
DRAPE HYSTEROSCOPY (MISCELLANEOUS) ×1 IMPLANT
DRAPE SHEET LG 3/4 BI-LAMINATE (DRAPES) ×1 IMPLANT
ELECT REM PT RETURN 9FT ADLT (ELECTROSURGICAL) ×1
ELECTRODE REM PT RTRN 9FT ADLT (ELECTROSURGICAL) ×1 IMPLANT
GAUZE 4X4 16PLY ~~LOC~~+RFID DBL (SPONGE) ×1 IMPLANT
GAUZE PAD ABD 8X10 STRL (GAUZE/BANDAGES/DRESSINGS) IMPLANT
GAUZE SPONGE 4X4 12PLY STRL (GAUZE/BANDAGES/DRESSINGS) IMPLANT
GAUZE SPONGE 4X4 12PLY STRL LF (GAUZE/BANDAGES/DRESSINGS) IMPLANT
GLOVE BIO SURGEON STRL SZ 6.5 (GLOVE) ×1 IMPLANT
GLOVE BIOGEL PI IND STRL 7.0 (GLOVE) ×1 IMPLANT
GLOVE INDICATOR 6.5 STRL GRN (GLOVE) ×1 IMPLANT
GOWN STRL REUS W/TWL XL LVL3 (GOWN DISPOSABLE) ×1 IMPLANT
HEMOSTAT SURGICEL 4X8 (HEMOSTASIS) IMPLANT
KIT SIGMOIDOSCOPE (SET/KITS/TRAYS/PACK) IMPLANT
KIT SLIDE ONE PROLAPS HEMORR (KITS) IMPLANT
KIT TURNOVER CYSTO (KITS) ×1 IMPLANT
LEGGING LITHOTOMY PAIR STRL (DRAPES) ×1 IMPLANT
LUBRICANT JELLY K Y 4OZ (MISCELLANEOUS) ×1 IMPLANT
NDL HYPO 22X1.5 SAFETY MO (MISCELLANEOUS) ×1 IMPLANT
NEEDLE HYPO 22X1.5 SAFETY MO (MISCELLANEOUS) ×1
PACK BASIN DAY SURGERY FS (CUSTOM PROCEDURE TRAY) ×1 IMPLANT
PAD ARMBOARD 7.5X6 YLW CONV (MISCELLANEOUS) IMPLANT
PENCIL SMOKE EVACUATOR (MISCELLANEOUS) ×1 IMPLANT
SLEEVE SCD COMPRESS KNEE MED (STOCKING) ×1 IMPLANT
SPIKE FLUID TRANSFER (MISCELLANEOUS) IMPLANT
SPONGE HEMORRHOID 8X3CM (HEMOSTASIS) IMPLANT
SUT CHROMIC 2 0 SH (SUTURE) IMPLANT
SUT CHROMIC 3 0 SH 27 (SUTURE) IMPLANT
SUT VIC AB 2-0 UR6 27 (SUTURE) IMPLANT
SYR CONTROL 10ML LL (SYRINGE) ×1 IMPLANT
TOWEL OR 17X24 6PK STRL BLUE (TOWEL DISPOSABLE) ×1 IMPLANT
TRAY DSU PREP LF (CUSTOM PROCEDURE TRAY) ×1 IMPLANT
TUBE CONNECTING 12X1/4 (SUCTIONS) ×1 IMPLANT
YANKAUER SUCT BULB TIP NO VENT (SUCTIONS) ×1 IMPLANT

## 2022-09-16 NOTE — Op Note (Signed)
09/16/2022  10:36 AM  PATIENT:  John Mccarty  52 y.o. male  Patient Care Team: Patient, No Pcp Per as PCP - General (General Practice) Patient, No Pcp Per (General Practice)  PRE-OPERATIVE DIAGNOSIS:  GRADE 3 HEMORRHOIDS  POST-OPERATIVE DIAGNOSIS:  GRADE 3 HEMORRHOIDS  PROCEDURE:  TRANSANAL HEMORRHOIDAL DEARTERIALIZATION   Surgeon(s): Romie Levee, MD  ASSISTANT: none   ANESTHESIA:   local and MAC  EBL:  40ml  DRAINS: none   SPECIMEN:  No Specimen  DISPOSITION OF SPECIMEN:  N/A  COUNTS:  YES  PLAN OF CARE: Discharge to home after PACU  PATIENT DISPOSITION:  PACU - hemodynamically stable.  INDICATION: 52 y.o. M with rectal bleeding and anemia   OR FINDINGS: Grade 3 hemorrhoids at all 3 locations  Description: Informed consent was confirmed. Patient underwent general anesthesia without difficulty. Patient was placed into lithotomy positioning.  The perianal region was prepped and draped in sterile fashion. Surgical time out confirmed or plan.  I did digital rectal examination and then transitioned over to anoscopy to get a sense of the anatomy.  I switched over to the [facility] fiberoptically lit Doppler anocope.   Using the Doppler on the tip of the THD anoscope, I identified the arterial hemorrhoidal vessels coming in in the classic hexagonal anatomical pattern  (right posterior/lateral/anterior, left posterior /lateral/anterior).    I proceeded to ligate the hemorrhoidal arteries. I used a 2-0 Vicryl suture on a UR-6 needle in a figure-of-eight fashion over the signal around 6 cm proximal to the anal verge. I then ran that stitch longitudinally more distally to the dentate line. I then tied that stitch down to cause a hemorrhoidopexy. I did that for all 6 locations.    I redid Doppler anoscopy. I Identified a signal at the L posterior location.  I isolated and ligated this with a figure-of-eight stitch. Signals went away.  At completion of this, all hemorrhoids were  reduced into the rectum.  There is no more prolapse. External anatomy looked normal.  I repeated anoscopy and examination.   Hemostasis was good.  Patient is being awakened from anesthesia and will go to recovery room.  I am about to discuss the patient's status to the family.    Vanita Panda, MD  Colorectal and General Surgery [facility]

## 2022-09-16 NOTE — Anesthesia Procedure Notes (Signed)
Procedure Name: MAC Date/Time: 09/16/2022 9:35 AM  Performed by: Briant Sites, CRNAPre-anesthesia Checklist: Patient identified, Emergency Drugs available, Suction available, Patient being monitored and Timeout performed Patient Re-evaluated:Patient Re-evaluated prior to induction Oxygen Delivery Method: Simple face mask Placement Confirmation: positive ETCO2

## 2022-09-16 NOTE — H&P (Signed)
REFERRING PHYSICIAN:  Boone Master, PA   PROVIDER:  Elenora Gamma, MD   MRN: [id number] DOB: 1970/04/09    Subjective     History of Present Illness: John Mccarty is a 52 y.o. male who is seen today as an office consultation at the request of Dr. Darreld Mclean for evaluation of New Consultation .  52 year old male who presents to the office for evaluation of bleeding hemorrhoids.  He states that he has had rectal bleeding for the past 8 years.  He was hospitalized in May due to rectal bleeding and anemia.  Colonoscopy was performed at that time and showed friable hemorrhoids but no other source for his bleeding.  He is here today to discuss this further.     Review of Systems: A complete review of systems was obtained from the patient.  I have reviewed this information and discussed as appropriate with the patient.  See HPI as well for other ROS.     Medical History:     Past Medical History:  Diagnosis Date   Anemia        There is no problem list on file for this patient.          Past Surgical History:  Procedure Laterality Date   VASCULAR SURGERY          No Known Allergies         Current Outpatient Medications on File Prior to Visit  Medication Sig Dispense Refill   cholecalciferol 1000 unit tablet Take by mouth       ferrous sulfate 325 (65 FE) MG EC tablet Take 325 mg by mouth daily with breakfast        No current facility-administered medications on file prior to visit.           Family History  Problem Relation Age of Onset   High blood pressure (Hypertension) Mother     Diabetes Father        Social History       Tobacco Use  Smoking Status Never  Smokeless Tobacco Never      Social History        Socioeconomic History   Marital status: Single  Tobacco Use   Smoking status: Never   Smokeless tobacco: Never  Substance and Sexual Activity   Alcohol use: Never   Drug use: Yes      Objective:      Vitals:   09/16/22 0716   BP: (!) 120/76  Pulse: 69  Resp: 16  Temp: 97.8 F (36.6 C)  SpO2: 100%    Exam Gen: NAD Abd: soft Rectal: normal tone, no external tags     Labs, Imaging and Diagnostic Testing:   Procedure: Anoscopy 08/15/22 Surgeon: Maisie Fus After the risks and benefits were explained, written consent was obtained for above procedure.  A medical assistant chaperone was present thoroughout the entire procedure.  Anesthesia: none Diagnosis: rectal bleeding Findings: Grade 3 RP and LL hemorrhoids   Assessment and Plan:  Diagnoses and all orders for this visit:   Prolapsed internal hemorrhoids, grade 50   53 year old male with grade 3 hemorrhoids and rectal bleeding causing anemia.  We discussed surgical correction of the disease.  Given that he has internal hemorrhoids only, I think he would be a good candidate for trans-hemorrhoidal dearterialization.  We discussed this in detail.  It has a 97% success rate with rectal bleeding.  We discussed typical postoperative pain and apical postoperative risks including bleeding, pain and urinary retention.  All questions were answered.  Patient would like to proceed with surgery.       Vanita Panda, MD Colon and Rectal Surgery [facility]

## 2022-09-16 NOTE — Anesthesia Postprocedure Evaluation (Signed)
Anesthesia Post Note  Patient: John Mccarty  Procedure(s) Performed: TRANSANAL HEMORRHOIDAL DEARTERIALIZATION (Rectum)     Patient location during evaluation: PACU Anesthesia Type: MAC Level of consciousness: awake and alert Pain management: pain level controlled Vital Signs Assessment: post-procedure vital signs reviewed and stable Respiratory status: spontaneous breathing, nonlabored ventilation and respiratory function stable Cardiovascular status: blood pressure returned to baseline Postop Assessment: no apparent nausea or vomiting Anesthetic complications: no   No notable events documented.  Last Vitals:  Vitals:   09/16/22 0716 09/16/22 1045  BP: (!) 120/7 (!) 165/151  Pulse: 69 83  Resp: 16 19  Temp: 36.6 C 36.4 C  SpO2: 100% 94%    Last Pain:  Vitals:   09/16/22 1045  TempSrc:   PainSc: 0-No pain                 Shanda Howells

## 2022-09-16 NOTE — Transfer of Care (Signed)
Immediate Anesthesia Transfer of Care Note  Patient: John Mccarty  Procedure(s) Performed: Procedure(s) (LRB): TRANSANAL HEMORRHOIDAL DEARTERIALIZATION (N/A)  Patient Location: PACU  Anesthesia Type: MAC  Level of Consciousness: awake, sedated, patient cooperative and responds to stimulation  Airway & Oxygen Therapy: Patient Spontanous Breathing and Patient connected to [place] oxygen  Post-op Assessment: Report given to PACU RN, Post -op Vital signs reviewed and stable and Patient moving all extremities  Post vital signs: Reviewed and stable  Complications: No apparent anesthesia complications

## 2022-09-16 NOTE — Discharge Instructions (Addendum)
ANORECTAL SURGERY: POST OP INSTRUCTIONS Take your usually prescribed home medications unless otherwise directed. DIET: During the first few hours after surgery sip on some liquids until you are able to urinate.  It is normal to not urinate for several hours after this surgery.  If you feel uncomfortable, please contact the office for instructions.  After you are able to urinate,you may eat, if you feel like it.  Follow a light bland diet the first 24 hours after arrival home, such as soup, liquids, crackers, etc.  Be sure to include lots of fluids daily (6-8 glasses).  Avoid fast food or heavy meals, as your are more likely to get nauseated.  Eat a low fat diet the next few days after surgery.  Limit caffeine intake to 1-2 servings a day. PAIN CONTROL: Pain is best controlled by a usual combination of several different methods TOGETHER: Muscle relaxation  Soak in a warm bath (or Sitz bath) three times a day and after bowel movements.  Continue to do this until all pain is resolved. Take the muscle relaxer (Valium) every 6 hours for the first 2 days after surgery.  Do not take at the same time as your prescription pain meds  Over the counter pain medication Prescription pain medication Most patients will experience some swelling and discomfort in the anus/rectal area and incisions.  Heat such as warm towels, sitz baths, warm baths, etc to help relax tight/sore spots and speed recovery.  Some people prefer to use ice, especially in the first couple days after surgery, as it may decrease the pain and swelling, or alternate between ice & heat.  Experiment to what works for you.  Swelling and bruising can take several weeks to resolve.  Pain can take even longer to completely resolve. It is helpful to take an over-the-counter pain medication regularly for the first few weeks.  Choose one of the following that works best for you: Naproxen (Aleve, etc)  Two 220mg  tabs twice a day Ibuprofen (Advil, etc) Three  200mg  tabs four times a day (every meal & bedtime) A  prescription for pain medication (such as percocet, oxycodone, hydrocodone, etc) should be given to you upon discharge.  Take your pain medication as prescribed.  If you are having problems/concerns with the prescription medicine (does not control pain, nausea, vomiting, rash, itching, etc), please call us [phone] to see if we need to switch you to a different pain medicine that will work better for you and/or control your side effect better. If you need a refill on your pain medication, please contact your pharmacy.  They will contact our office to request authorization. Prescriptions will not be filled after 5 pm or on week-ends. KEEP YOUR BOWELS REGULAR and AVOID CONSTIPATION The goal is one to two soft bowel movements a day.  You should at least have a bowel movement every other day. Avoid getting constipated.  Between the surgery and the pain medications, it is common to experience some constipation. This can be very painful after rectal surgery.  Increasing fluid intake and taking a fiber supplement (such as Metamucil, Citrucel, FiberCon, etc) 1-2 times a day regularly will usually help prevent this problem from occurring.  A stool softener like colace is also recommended.  This can be purchased over the counter at your pharmacy.  You can take it up to 3 times a day.  If you do not have a bowel movement after 24 hrs since your surgery, take one does of milk of  magnesia.  If you still haven't had a bowel movement 8-12 hours after that dose, take another dose.  If you don't have a bowel movement 48 hrs after surgery, purchase a Fleets enema from the drug store and administer gently per package instructions.  If you still are having trouble with your bowel movements after that, please call the office for further instructions. If you develop diarrhea or have many loose bowel movements, simplify your diet to bland foods & liquids for a few days.   Stop any stool softeners and decrease your fiber supplement.  Switching to mild anti-diarrheal medications (Kayopectate, Pepto Bismol) can help.  If this worsens or does not improve, please call us.  Wound Care Remove your bandages before your first bowel movement or 8 hours after surgery.     Remove any wound packing material at this tim,e as well.  You do not need to repack the wound unless instructed otherwise.  Wear an absorbent pad or soft cotton gauze in your underwear to catch any drainage and help keep the area clean. You should change this every 2-3 hours while awake. Keep the area clean and dry.  Bathe / shower every day, especially after bowel movements.  Keep the area clean by showering / bathing over the incision / wound.   It is okay to soak an open wound to help wash it.  Wet wipes or showers / gentle washing after bowel movements is often less traumatic than regular toilet paper. You may have some styrofoam-like soft packing in the rectum which will come out with the first bowel movement.  You will often notice bleeding with bowel movements.  This should slow down by the end of the first week of surgery Expect some drainage.  This should slow down, too, by the end of the first week of surgery.  Wear an absorbent pad or soft cotton gauze in your underwear until the drainage stops. Do Not sit on a rubber or pillow ring.  This can make you symptoms worse.  You may sit on a soft pillow if needed.  ACTIVITIES as tolerated:   You may resume regular (light) daily activities beginning the next day--such as daily self-care, walking, climbing stairs--gradually increasing activities as tolerated.  If you can walk 30 minutes without difficulty, it is safe to try more intense activity such as jogging, treadmill, bicycling, low-impact aerobics, swimming, etc. Save the most intensive and strenuous activity for last such as sit-ups, heavy lifting, contact sports, etc  Refrain from any heavy lifting or  straining until you are off narcotics for pain control.   You may drive when you are no longer taking prescription pain medication, you can comfortably sit for long periods of time, and you can safely maneuver your car and apply brakes. You may have sexual intercourse when it is comfortable.  FOLLOW UP in our office Please call [facility] at [phone] to set up an appointment to see your surgeon in the office for a follow-up appointment approximately 3-4 weeks after your surgery. Make sure that you call for this appointment the day you arrive home to insure a convenient appointment time. 10. IF YOU HAVE DISABILITY OR FAMILY LEAVE FORMS, BRING THEM TO THE OFFICE FOR PROCESSING.  DO NOT GIVE THEM TO YOUR DOCTOR.     WHEN TO CALL us [phone]: Poor pain control Reactions / problems with new medications (rash/itching, nausea, etc)  Fever over 101.5 F (38.5 C) Inability to urinate Nausea and/or vomiting Worsening swelling  or bruising Continued bleeding from incision. Increased pain, redness, or drainage from the incision  The clinic staff is available to answer your questions during regular business hours (8:30am-5pm).  Please don't hesitate to call and ask to speak to one of our nurses for clinical concerns.   A surgeon from [facility] is always on call at the hospitals   If you have a medical emergency, go to the nearest emergency room or call 911.    [facility] [address], [place], [place]  [address] ? MAIN: [phone] ? TOLL FREE: [phone] ? FAX [phone] [email or web address]    Post Anesthesia Home Care Instructions  Activity: Get plenty of rest for the remainder of the day. A responsible adult should stay with you for 24 hours following the procedure.  For the next 24 hours, DO NOT: -Drive a car -Advertising copywriter -Drink alcoholic beverages -Take any medication unless instructed by your  physician -Make any legal decisions or sign important papers.  Meals: Start with liquid foods such as gelatin or soup. Progress to regular foods as tolerated. Avoid greasy, spicy, heavy foods. If nausea and/or vomiting occur, drink only clear liquids until the nausea and/or vomiting subsides. Call your physician if vomiting continues.  Special Instructions/Symptoms: Your throat may feel dry or sore from the anesthesia or the breathing tube placed in your throat during surgery. If this causes discomfort, gargle with warm salt water. The discomfort should disappear within 24 hours.  If you had a scopolamine patch placed behind your ear for the management of post- operative nausea and/or vomiting:  1. The medication in the patch is effective for 72 hours, after which it should be removed.  Wrap patch in a tissue and discard in the trash. Wash hands thoroughly with soap and water. 2. You may remove the patch earlier than 72 hours if you experience unpleasant side effects which may include dry mouth, dizziness or visual disturbances. 3. Avoid touching the patch. Wash your hands with soap and water after contact with the patch.  Information for Discharge Teaching: EXPAREL (bupivacaine liposome injectable suspension)   Your surgeon gave you EXPAREL(bupivacaine) in your surgical incision to help control your pain after surgery.  EXPAREL is a local anesthetic that provides pain relief by numbing the tissue around the surgical site. EXPAREL is designed to release pain medication over time and can control pain for up to 72 hours. Depending on how you respond to EXPAREL, you may require less pain medication during your recovery.  Possible side effects: Temporary loss of sensation or ability to move in the area where bupivacaine was injected. Nausea, vomiting, constipation Rarely, numbness and tingling in your mouth or lips, lightheadedness, or anxiety may occur. Call your doctor right away if you think  you may be experiencing any of these sensations, or if you have other questions regarding possible side effects.  Follow all other discharge instructions given to you by your surgeon or nurse. Eat a healthy diet and drink plenty of water or other fluids.  If you return to the hospital for any reason within 96 hours following the administration of EXPAREL, please inform your health care providers.

## 2022-09-16 NOTE — Anesthesia Preprocedure Evaluation (Addendum)
Anesthesia Evaluation  Patient identified by MRN, date of birth, ID band Patient awake    Reviewed: Allergy & Precautions, NPO status , Patient's Chart, lab work & pertinent test results  History of Anesthesia Complications Negative for: history of anesthetic complications  Airway Mallampati: II  TM Distance: >3 FB Neck ROM: Full    Dental no notable dental hx.    Pulmonary neg pulmonary ROS   Pulmonary exam normal        Cardiovascular negative cardio ROS Normal cardiovascular exam     Neuro/Psych negative neurological ROS     GI/Hepatic Neg liver ROS,,,hemorrhoids   Endo/Other  negative endocrine ROS    Renal/GU negative Renal ROS  negative genitourinary   Musculoskeletal negative musculoskeletal ROS (+)    Abdominal   Peds  Hematology negative hematology ROS (+)   Anesthesia Other Findings Day of surgery medications reviewed with patient.  Reproductive/Obstetrics                              Anesthesia Physical Anesthesia Plan  ASA: 1  Anesthesia Plan: MAC   Post-op Pain Management: Minimal or no pain anticipated   Induction:   PONV Risk Score and Plan: 1 and Treatment may vary due to age or medical condition and Propofol infusion  Airway Management Planned: Natural Airway and Simple Face Mask  Additional Equipment: None  Intra-op Plan:   Post-operative Plan:   Informed Consent: I have reviewed the patients History and Physical, chart, labs and discussed the procedure including the risks, benefits and alternatives for the proposed anesthesia with the patient or authorized representative who has indicated his/her understanding and acceptance.       Plan Discussed with: CRNA  Anesthesia Plan Comments:         Anesthesia Quick Evaluation

## 2022-09-19 ENCOUNTER — Encounter (HOSPITAL_BASED_OUTPATIENT_CLINIC_OR_DEPARTMENT_OTHER): Payer: Self-pay | Admitting: General Surgery
